# Patient Record
Sex: Female | Born: 1960 | Race: White | Hispanic: No | Marital: Married | Smoking: Never smoker
Health system: Southern US, Community
[De-identification: ages and names within clinical notes are randomized; demographics above are authoritative.]

## PROBLEM LIST (undated history)

## (undated) DIAGNOSIS — F32A Depression, unspecified: Secondary | ICD-10-CM

## (undated) DIAGNOSIS — N3010 Interstitial cystitis (chronic) without hematuria: Secondary | ICD-10-CM

## (undated) DIAGNOSIS — K219 Gastro-esophageal reflux disease without esophagitis: Secondary | ICD-10-CM

## (undated) HISTORY — DX: Gastro-esophageal reflux disease without esophagitis: K21.9

## (undated) HISTORY — DX: Depression, unspecified: F32.A

---
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
## 2001-02-26 ENCOUNTER — Other Ambulatory Visit: Admission: RE | Admit: 2001-02-26 | Discharge: 2001-02-26 | Payer: Self-pay | Admitting: *Deleted

## 2003-09-19 ENCOUNTER — Encounter: Payer: Self-pay | Admitting: Family Medicine

## 2003-09-19 LAB — CONVERTED CEMR LAB
Blood Glucose, Fasting: 87 mg/dL
RBC count: 4.61 10*6/uL
WBC, blood: 4.3 10*3/uL

## 2003-09-20 ENCOUNTER — Encounter: Payer: Self-pay | Admitting: Family Medicine

## 2003-09-23 HISTORY — PX: PARTIAL HYSTERECTOMY: SHX80

## 2004-09-30 ENCOUNTER — Ambulatory Visit: Payer: Self-pay | Admitting: Family Medicine

## 2004-10-29 ENCOUNTER — Other Ambulatory Visit: Admission: RE | Admit: 2004-10-29 | Discharge: 2004-10-29 | Payer: Self-pay | Admitting: Obstetrics and Gynecology

## 2005-05-06 ENCOUNTER — Other Ambulatory Visit: Admission: RE | Admit: 2005-05-06 | Discharge: 2005-05-06 | Payer: Self-pay | Admitting: Obstetrics and Gynecology

## 2005-06-17 ENCOUNTER — Emergency Department: Payer: Self-pay | Admitting: Emergency Medicine

## 2005-09-25 ENCOUNTER — Encounter (INDEPENDENT_AMBULATORY_CARE_PROVIDER_SITE_OTHER): Payer: Self-pay | Admitting: *Deleted

## 2005-09-25 ENCOUNTER — Inpatient Hospital Stay (HOSPITAL_COMMUNITY): Admission: RE | Admit: 2005-09-25 | Discharge: 2005-09-27 | Payer: Self-pay | Admitting: Obstetrics and Gynecology

## 2006-11-27 ENCOUNTER — Ambulatory Visit: Payer: Self-pay | Admitting: Family Medicine

## 2007-10-05 ENCOUNTER — Ambulatory Visit: Payer: Self-pay | Admitting: Family Medicine

## 2007-10-05 ENCOUNTER — Telehealth: Payer: Self-pay | Admitting: Family Medicine

## 2008-01-13 ENCOUNTER — Encounter: Payer: Self-pay | Admitting: Family Medicine

## 2008-07-13 ENCOUNTER — Encounter: Admission: RE | Admit: 2008-07-13 | Discharge: 2008-07-13 | Payer: Self-pay | Admitting: Obstetrics and Gynecology

## 2008-09-11 ENCOUNTER — Ambulatory Visit: Payer: Self-pay | Admitting: Family Medicine

## 2008-09-11 DIAGNOSIS — R109 Unspecified abdominal pain: Secondary | ICD-10-CM

## 2008-09-11 LAB — CONVERTED CEMR LAB
Bilirubin Urine: NEGATIVE
Ketones, urine, test strip: NEGATIVE
Nitrite: NEGATIVE
Specific Gravity, Urine: <1.005
Urobilinogen, UA: 0.2
WBC Urine, dipstick: NEGATIVE

## 2008-09-12 ENCOUNTER — Encounter: Payer: Self-pay | Admitting: Family Medicine

## 2008-09-12 DIAGNOSIS — N3010 Interstitial cystitis (chronic) without hematuria: Secondary | ICD-10-CM | POA: Insufficient documentation

## 2008-10-03 ENCOUNTER — Ambulatory Visit: Payer: Self-pay | Admitting: Family Medicine

## 2008-10-03 LAB — CONVERTED CEMR LAB
Albumin: 3.8 g/dL (ref 3.5–5.2)
Alkaline Phosphatase: 58 units/L (ref 39–117)
Basophils Absolute: 0 10*3/uL (ref 0.0–0.1)
Bilirubin, Direct: 0.1 mg/dL (ref 0.0–0.3)
Calcium: 8.9 mg/dL (ref 8.4–10.5)
Cholesterol: 180 mg/dL (ref 0–200)
Eosinophils Absolute: 0.1 10*3/uL (ref 0.0–0.7)
GFR calc Af Amer: 114 mL/min
GFR calc non Af Amer: 95 mL/min
Glucose, Bld: 93 mg/dL (ref 70–99)
HCT: 40.4 % (ref 36.0–46.0)
HDL: 38.3 mg/dL — ABNORMAL LOW (ref >39.0)
Hemoglobin: 14 g/dL (ref 12.0–15.0)
Lipase: 24 units/L (ref 11.0–59.0)
MCHC: 34.7 g/dL (ref 30.0–36.0)
MCV: 88.5 fL (ref 78.0–100.0)
Monocytes Absolute: 0.4 10*3/uL (ref 0.1–1.0)
Monocytes Relative: 8 % (ref 3.0–12.0)
Neutro Abs: 2.3 10*3/uL (ref 1.4–7.7)
Platelets: 260 10*3/uL (ref 150–400)
Potassium: 3.5 meq/L (ref 3.5–5.1)
RDW: 12.2 % (ref 11.5–14.6)
Sodium: 138 meq/L (ref 135–145)
Total CHOL/HDL Ratio: 4.7
Total Protein: 7.4 g/dL (ref 6.0–8.3)
Triglycerides: 70 mg/dL (ref 0–149)
Uric Acid, Serum: 3.6 mg/dL (ref 2.4–7.0)

## 2008-10-10 ENCOUNTER — Ambulatory Visit: Payer: Self-pay | Admitting: Family Medicine

## 2008-10-10 DIAGNOSIS — E7800 Pure hypercholesterolemia, unspecified: Secondary | ICD-10-CM | POA: Insufficient documentation

## 2008-12-25 ENCOUNTER — Ambulatory Visit: Payer: Self-pay | Admitting: Family Medicine

## 2008-12-25 DIAGNOSIS — H6690 Otitis media, unspecified, unspecified ear: Secondary | ICD-10-CM | POA: Insufficient documentation

## 2008-12-25 DIAGNOSIS — H6980 Other specified disorders of Eustachian tube, unspecified ear: Secondary | ICD-10-CM

## 2009-07-05 ENCOUNTER — Encounter: Admission: RE | Admit: 2009-07-05 | Discharge: 2009-07-05 | Payer: Self-pay | Admitting: Obstetrics and Gynecology

## 2010-02-26 ENCOUNTER — Ambulatory Visit: Payer: Self-pay | Admitting: Urology

## 2010-03-05 ENCOUNTER — Ambulatory Visit: Payer: Self-pay | Admitting: Urology

## 2010-04-30 ENCOUNTER — Encounter (INDEPENDENT_AMBULATORY_CARE_PROVIDER_SITE_OTHER): Payer: Self-pay | Admitting: *Deleted

## 2010-07-08 ENCOUNTER — Encounter: Admission: RE | Admit: 2010-07-08 | Discharge: 2010-07-08 | Payer: Self-pay | Admitting: Unknown Physician Specialty

## 2010-09-02 ENCOUNTER — Ambulatory Visit: Payer: Self-pay | Admitting: Gastroenterology

## 2010-09-03 LAB — PATHOLOGY REPORT

## 2010-10-22 NOTE — Letter (Signed)
Summary: Nadara Eaton letter  [facility] at [facility]  [address] [place], [place] [address]   Phone: [phone]  Fax: [phone]       04/30/2010 MRN: [id number]  [facility] [address] [place], [place]  [address]  Dear Ms. [facility] [place], and  announce the retirement of Arta Silence, M.D., from full-time practice at the [facility] office effective March 21, 2010 and his plans of returning part-time.  It is important to Dr. Hetty Ely and to our practice that you understand that [facility] has seven physicians in our office for your health care needs.  We will continue to offer the same exceptional care that you have today.    Dr. Hetty Ely has spoken to many of you about his plans for retirement and returning part-time in the fall.   We will continue to work with you through the transition to schedule appointments for you in the office and meet the high standards that [facility] is committed to.   Again, it is with great pleasure that we share the news that Dr. Hetty Ely will return to [facility] in October of 2011 with a reduced schedule.    If you have any questions, or would like to request an appointment with one of our physicians, please call us at [phone] and press the option for Scheduling an appointment.  We take pleasure in providing you with excellent patient care and look forward to seeing you at your next office visit.  Our [facility] Physicians are:  Tillman Abide, M.D. Laurita Quint, M.D. Roxy Manns, M.D. Kerby Nora, M.D. Hannah Beat, M.D. Ruthe Mannan, M.D. We proudly welcomed Raechel Ache, M.D. and Eustaquio Boyden, M.D. to the practice in July/August 2011.  Sincerely,  [facility]

## 2010-12-18 ENCOUNTER — Ambulatory Visit (INDEPENDENT_AMBULATORY_CARE_PROVIDER_SITE_OTHER): Payer: 59 | Admitting: Internal Medicine

## 2010-12-18 ENCOUNTER — Encounter: Payer: Self-pay | Admitting: Internal Medicine

## 2010-12-18 DIAGNOSIS — K219 Gastro-esophageal reflux disease without esophagitis: Secondary | ICD-10-CM | POA: Insufficient documentation

## 2010-12-18 DIAGNOSIS — J069 Acute upper respiratory infection, unspecified: Secondary | ICD-10-CM

## 2010-12-24 NOTE — Assessment & Plan Note (Signed)
Summary: coughing,congestion,runny nose   Vital Signs:  Patient Profile:   50 Years Old Female CC:      cough and nasal congestion x 4 days Height:     64 inches (162.56 cm) Temp:     99.3 degrees F Pulse rate:   88 / minute Resp:     14 per minute                  Current Allergies: ! 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 Also husband needed antibiotics to get well last week. Insidious onset of congestion with progressively more purulent nasal discharge and sputum. Has slight sore throat, fatigue, and mild frontal pressure.  REVIEW OF SYSTEMS Constitutional Symptoms       Complains of fatigue.     Denies fever, chills, night sweats, weight loss, and weight gain.  Eyes       Denies change in vision, eye pain, eye discharge, glasses, contact lenses, and eye surgery. Ear/Nose/Throat/Mouth       Complains of frequent runny nose, sore throat, and hoarseness.      Denies hearing loss/aids, change in hearing, ear pain, ear discharge, dizziness, frequent nose bleeds, sinus problems, and tooth pain or bleeding.  Respiratory       Complains of dry cough and productive cough.      Denies wheezing, shortness of breath, asthma, bronchitis, and emphysema/COPD.  Cardiovascular       Denies murmurs, chest pain, and tires easily with exhertion.    Gastrointestinal       Denies stomach pain, nausea/vomiting, diarrhea, constipation, blood in bowel movements, and indigestion. Genitourniary       Denies painful urination, kidney stones, and loss of urinary control. Neurological       Denies paralysis, seizures, and fainting/blackouts. Musculoskeletal       Denies muscle pain, joint pain, joint stiffness, decreased range of motion, redness, swelling, muscle weakness, and gout.  Skin       Denies bruising, unusual mles/lumps or sores, and hair/skin or nail changes.  Psych     Denies mood changes, temper/anger issues, anxiety/stress, speech problems, depression, and sleep problems.  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[employer] Never Smoked Alcohol use-no Drug use-no  Past Medical History: interstitial cystitis GERD  Social History: Marital Status: Married [employer]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 septum deviated to right Oral/Pharynx: tongue normal, posterior pharynx with cobblestoning and mildly purulent pnd Neck: supple,anterior lymphadenopathy present Chest/Lungs: no rales, wheezes, or rhonchi bilateral, breath sounds equal without effort Extremities: normal extremities Neurological: grossly intact  and non-focal Skin: no obvious rashe MSE: oriented to time, place, and person Assessment New Problems: GERD (ICD-530.81) UPPER RESPIRATORY INFECTION, ACUTE (ICD-465.9)   Plan New Medications/Changes: AZITHROMYCIN 250 MG TABS (AZITHROMYCIN) 2 by mouth then 1 by mouth qd  #6 x 0,  12/18/2010, J. Juline Patch MD   The patient and/or caregiver has been counseled thoroughly with regard to medications prescribed including dosage, schedule, interactions, rationale for use, and possible side effects and they verbalize understanding.  Diagnoses and expected course of recovery discussed and will return if not improved as expected or if the condition worsens. Patient and/or caregiver verbalized understanding.  Prescriptions: AZITHROMYCIN 250 MG TABS (AZITHROMYCIN) 2 by mouth then 1 by mouth qd  #6 x 0   Entered and Authorized by:   J. Juline Patch MD   Signed by:   Shela Commons. Juline Patch MD on 12/18/2010   Method used:   Electronically to        [id number] (retail)       [address]       [place], [place]  [address]       Ph: [phone]       Fax: [phone]   RxID:   [id number]   Patient Instructions: 1)  claritin-D every 12 hrs, later switch to claritin 10 mg daily. 2)  afrin generic 2 sprays in each nostril every 12 hours for 3 days maximum followed by 3 days of non-use. Continue cycle as needed to control nasal or ear congestion . 3)  Take 650-1000mg  of Tylenol every 4-6 hours as needed for relief of pain or comfort of fever AVOID taking more than 4000mg   in a 24 hour period (can cause liver damage in higher doses).  4)  We close forever at 5 pm today so followup needs to be at another clinic or emergency dept.

## 2010-12-24 NOTE — Letter (Signed)
Summary: History Form  History Form   Imported By: Eugenio Hoes 12/18/2010 16:18:30  _____________________________________________________________________  External Attachment:    Type:   Image     Comment:   External Document

## 2011-02-07 NOTE — Discharge Summary (Signed)
Angela Park, Angela Park                ACCOUNT NO.:  [id number]   MEDICAL RECORD NO.:  [id number]          PATIENT TYPE:  INP   LOCATION:  9309                          FACILITY:  [facility]   PHYSICIAN:  Dineen Kid. Rana Snare, M.D.    DATE OF BIRTH:  December 05, 1960   DATE OF ADMISSION:  09/25/2005  DATE OF DISCHARGE:  09/27/2005                                 DISCHARGE SUMMARY   HISTORY OF PRESENT ILLNESS:  Ms. Hafford is a 50 year old G4, P3, A1, with  symptomatic pelvic relaxation including urinary frequency, vaginal pressure,  without urinary incontinence.  She had normal cystometrics and urodynamics  as performed by Dr. Brunilda Payor, who is a urologist.  She does have uterine  prolapse to the introitus with Valsalva.  She desires definitive surgical  intervention and presents for hysterectomy with anterior and posterior  colporrhaphy and sacrospinous colposuspension.   HOSPITAL COURSE:  The patient underwent laparoscopically-assisted vaginal  hysterectomy with anterior and posterior colporrhaphy with sacrospinous  suspension of the vagina.  The surgery was uncomplicated.  Estimated blood  loss was 250 mL.  Her postoperative care was complicated by urinary  retention.  Postoperative day #1, her postoperative hemoglobin was 12.6.  She had normoactive bowel sounds.  Her vaginal pack had been removed.  She  was tolerating a regular diet.  By postoperative day #2, after discontinuing  the Foley she was able to void up 400 mL, still had a full sensation.  Ultrasound of the bladder was performed, showing a 525 mL postvoid residual.  The Foley catheter was replaced and the patient was discharged home with a  Foley catheter in place and on Macrobid.   DISPOSITION:  The patient will follow up in the office in three days.  Continue with the Foley catheter until the time of follow-up.  Sent home  with a prescription for Macrobid and also a prescription for Tylox #30, told  to return for increased pain, fever,  bleeding.      Dineen Kid Rana Snare, M.D.  Electronically Signed     DCL/MEDQ  D:  10/22/2005  T:  10/22/2005  Job:  [id number]

## 2011-02-07 NOTE — H&P (Signed)
NAMENAZARENE, BUNNING                 ACCOUNT NO.:  [id number]   MEDICAL RECORD NO.:  [id number]          PATIENT TYPE:   LOCATION:                                 FACILITY:   PHYSICIAN:  Dineen Kid. Rana Snare, M.D.         DATE OF BIRTH:   DATE OF ADMISSION:  09/25/2005  DATE OF DISCHARGE:                                HISTORY & PHYSICAL   Ms. Whiston is a 50 year old G4, P3, A1, with significant problems with pelvic  relaxation, urinary frequency and vaginal pressure.  Denies any urinary  incontinence.  Her pelvic examination is consistent with a third degree  rectocele, cystocele and also prolapse of the uterus to the introitus with  Valsalva.  She does have good UV angle support with Valsalva and has  undergone a recent cystometrics and urodynamics by Dr. Brunilda Payor, who is a  urologist, which showed no significant incontinence issues.  She presents  for definitive surgical intervention and planned hysterectomy with anterior  and posterior colporrhaphy and sacrospinous colposuspension.   PAST MEDICAL HISTORY:  Significant for irritable bowel syndrome.   PAST SURGICAL HISTORY:  Significant for tonsillectomy as a child.   PAST OBSTETRICAL HISTORY:  She has had three vaginal deliveries and one  miscarriage.   MEDICATIONS:  Currently Allegra and Prilosec.   She has no known drug allergies.   PHYSICAL EXAMINATION:  VITAL SIGNS:  Her blood pressure is 112/68,  hemoglobin is 12.3.  CARDIAC:  Regular rate and rhythm.  LUNGS:  Clear to auscultation bilaterally.  ABDOMEN:  Nondistended, nontender.  PELVIC:  External genitalia is essentially normal.  The uterus is  anteverted, mobile, no tenderness to deep palpation.  No adnexal masses are  palpable.  With Valsalva, the cervix does present to the introitus.  She  does have minimal cystocele.  She does have a third degree rectocele.   Ultrasound evaluation of the uterus on June 17, 2005, shows a  retroverted uterus with a 1.3 cm fibroid,  which is intramural in location,  also 2.5 cm and 1.9 cm area as well.  Ovaries are essentially normal in  appearance.   IMPRESSION AND PLAN:  Systematic pelvic relaxation and uterine prolapse.  The patient desires to proceed with surgical intervention.  Dr. Brunilda Payor has  confirmed with cystometrics and urodynamics no evidence of incontinence.  At  this time we do recommend an anterior and posterior colporrhaphy,  sacrospinous suspension of the vagina, as well as laparoscopically-assisted  vaginal hysterectomy.  She desires preservation of both ovaries if they  appear to be normal.  I discussed the procedure at length, including the  risks and benefits.  I discussed this with her husband present.  The risks  include but are not limited to risk of infection, bleeding, damage to bowel,  bladder, ureters, ovaries, the possibility that this may recur in the  future, or problems with the bladder such as prolonged catheter  wear or leaking of urine after the procedure.  I discussed recovery time as  well as the recommendation of light activity for a minimum of six  weeks and  no heavy straining for the next six months.  All of her questions were  answered, and she has given informed consent.      Dineen Kid Rana Snare, M.D.  Electronically Signed     DCL/MEDQ  D:  09/24/2005  T:  09/25/2005  Job:  [id number]

## 2011-02-07 NOTE — Op Note (Signed)
NAMEANICA, Angela Park                ACCOUNT NO.:  [id number]   MEDICAL RECORD NO.:  [id number]          PATIENT TYPE:  OBV   LOCATION:  9399                          FACILITY:  [facility]   PHYSICIAN:  Dineen Kid. Rana Snare, M.D.    DATE OF BIRTH:  Dec 24, 1960   DATE OF PROCEDURE:  09/25/2005  DATE OF DISCHARGE:                                 OPERATIVE REPORT   PREOPERATIVE DIAGNOSIS:  Symptomatic pelvic relaxation with cystocele,  rectocele and uterine prolapse.   POSTOPERATIVE DIAGNOSIS:  Symptomatic pelvic relaxation with cystocele,  rectocele and uterine prolapse.   PROCEDURE:  Laparoscopically assisted vaginal hysterectomy with anterior and  posterior colporrhaphy and sacrospinous ligament suspension of the vagina.   SURGEON:  Dineen Kid. Rana Snare, M.D.   ASSISTANT:  Luvenia Redden, M.D.   ANESTHESIA:  General endotracheal.   INDICATIONS:  Angela Park is a 50 year old G4, P3, A1, with pelvic relaxation,  urinary frequency and vaginal pressure, without urinary incontinence.  She  had a normal cystometrics and urodynamics by Dr. Brunilda Park.  The uterus does  prolapse to the introitus with Valsalva.  She desires to have a surgical  intervention and presents for hysterectomy with anterior and posterior  colporrhaphy and sacrospinous colposuspension.  Risks and benefits were  discussed at length and informed consent was obtained.   FINDINGS AT TIME OF SURGERY:  Normal-appearing appendix; normal-appearing  liver and gallbladder; uterus slightly enlarged and boggy; normal-appearing  ovaries.   DESCRIPTION OF PROCEDURE:  After adequate analgesia, the patient was placed  in the dorsal lithotomy position.  She was sterilely prepped and draped.  The bladder was sterilely drained.  A Hulka tenaculum was placed on the  cervix.  A 1-cm infraumbilical skin incision was made; a Veress needle was  inserted.  The abdomen was insufflated to dullness to percussion.  An 11-mm  trocar was inserted.  The laparoscope was  then inserted and the above  findings were noted.  A 5-mm trocar was inserted to the left of the midline  2 fingerbreadths above the pubic symphysis under direct visualization.  Gyrus cutting forceps were used to grasp the left foreign body, to ligate  and dissect across the fallopian tube, the utero-ovarian ligament and the  round ligament.  The right utero-ovarian ligament was identified in a  similar fashion and ligated and dissected across the utero-ovarian ligament,  the round ligament and the fallopian tube on the right side with good  hemostasis achieved.  The bladder was then elevated and a small window was  made using cutting forceps at the ureterovesical junction, creating a small  bladder flap.  The abdomen was then desufflated, legs were repositioned, a  weighted speculum was placed in the vagina and a posterior colpotomy was  performed.  The cervix was circumscribed with Bovie cautery.  A LigaSure  instrument was used to ligate across the uterosacral ligaments bilaterally  and bladder pillars bilaterally.  The anterior vaginal mucosa was then  undermined and the anterior peritoneum was entered sharply.  The Deaver  retractors were placed underneath the bladder.  The LigaSure was used to  ligate across the uterine vasculature on the inferior portion of the broad  ligament and dissected with the Mayo scissors.  The uterus was then removed  intact.  A small ribbon pack was placed.  The uterosacral ligaments were  identified and suture-ligated with a figure-of-eight of 0 Monocryl suture.  The posterior peritoneum was closed in a pursestring fashion using 0  Monocryl suture.  The posterior vaginal mucosa was closed in a vertical  fashion using figure-of-eights of 0 Monocryl suture.  The anterior vaginal  mucosa was then closed after the pack was removed with figure-of-eights of 0  Monocryl suture.   The apex of the vagina was grasped.  The anterior vaginal mucosa was  dissected in  the midline and reflected laterally up to the UV angle.  Sutures of 0 Monocryl were used to plicate the pubovesicocervical fascia in  the midline using figure-of-eights of 0 Monocryl suture with good  approximation and good hemostasis achieved.  Excess vaginal mucosa was then  removed and the anterior vaginal mucosa was closed with 2-0 Monocryl in  running fashion.  Allis clamps were used to clamp across the hymenal ring at  the posterior fourchette.  A triangular flap was made across the perineal  body and the posterior vaginal mucosa was then undermined with the  Metzenbaum scissors up to the apex of the vagina, reflected laterally with  the Metzenbaum scissors and the puborectal fascia was dissected off the  posterior vaginal mucosa.  Blunt dissection was carried out to the right  sacrospinous ligament.  Two sutures of 0 Prolene were placed using the Shutt  suture ligature carrier with good placement noted and good support noted  through the sacrospinous ligament.  A free needle was used to suture-ligate  this in a pulley fashion to the vaginal apex, care taken to stay below the  vaginal mucosa.  A posterior colporrhaphy was carried out, plicating the  rectal fascia in the midline using figure-of-eights of 0 Monocryl suture in  2 layers with good approximation noted and good hemostasis noted.  Excess  posterior vaginal mucosa was then excised.  The apex was then grasped and  beginning the closure of vaginal apex with a running 2-0 Monocryl suture to  the mid-portion of the vagina.  The sacrospinous suspension sutures were  snugged; 1 suture broke in the process of approximating the vaginal mucosa;  the second suture appeared to have good support and after properly being  tied down and cut, good support noted.  The remaining portion of the vaginal  mucosa was closed.  The perineal body was reinforced using 2 sutures of 0  Monocryl suture in a figure-of-eight fashion across the  superficial tranverse perinei and bulbocavernosus muscles.  The perineum was then closed  using the 2-0 Monocryl in a subcuticular fashion with good approximation and  good hemostasis noted.  Pelvic exam at this time revealed good posterior  support, anterior support of the vaginal apex.  The vagina was packed with  gauze using estrogen cream.  Legs were repositioned, abdomen was  reinsufflated and after a copious amount of irrigation and adequate  hemostasis was assured, the trocars were removed.  The infraumbilical skin  incision was closed with a 0 Vicryl interrupted sutures in the fascia and 3-  0 Vicryl Rapide subcuticular suture.  The 5-mm site was closed with a 3-0  Vicryl Rapide subcuticular suture.  The incisions were injected with 0.25%  Marcaine, a total of 10 mL used.  The patient was  then transferred to the  recovery room in stable condition.  Sponge and instrument count was normal  x3.  Estimated blood loss was 250 mL.  The patient received 1 g of Rocephin  preoperatively.      Dineen Kid Rana Snare, M.D.  Electronically Signed     DCL/MEDQ  D:  09/25/2005  T:  09/25/2005  Job:  [id number]

## 2011-07-29 ENCOUNTER — Ambulatory Visit: Payer: Self-pay | Admitting: Family Medicine

## 2012-08-04 ENCOUNTER — Ambulatory Visit: Payer: Self-pay | Admitting: Family Medicine

## 2013-08-08 ENCOUNTER — Ambulatory Visit: Payer: Self-pay | Admitting: Family Medicine

## 2014-11-14 DIAGNOSIS — N951 Menopausal and female climacteric states: Secondary | ICD-10-CM | POA: Insufficient documentation

## 2014-11-22 DIAGNOSIS — D229 Melanocytic nevi, unspecified: Secondary | ICD-10-CM

## 2014-11-22 HISTORY — DX: Melanocytic nevi, unspecified: D22.9

## 2015-09-20 ENCOUNTER — Other Ambulatory Visit: Payer: Self-pay

## 2015-09-20 ENCOUNTER — Other Ambulatory Visit: Payer: Self-pay | Admitting: *Deleted

## 2015-09-20 DIAGNOSIS — Z1231 Encounter for screening mammogram for malignant neoplasm of breast: Secondary | ICD-10-CM

## 2015-10-03 ENCOUNTER — Ambulatory Visit
Admission: RE | Admit: 2015-10-03 | Discharge: 2015-10-03 | Disposition: A | Discharge status: Home / Self Care | Payer: PRIVATE HEALTH INSURANCE | Source: Ambulatory Visit | Attending: *Deleted | Admitting: *Deleted

## 2015-10-03 DIAGNOSIS — Z1231 Encounter for screening mammogram for malignant neoplasm of breast: Secondary | ICD-10-CM

## 2015-10-08 ENCOUNTER — Ambulatory Visit: Payer: Self-pay

## 2016-05-30 DIAGNOSIS — F419 Anxiety disorder, unspecified: Secondary | ICD-10-CM | POA: Insufficient documentation

## 2016-05-30 DIAGNOSIS — F32A Depression, unspecified: Secondary | ICD-10-CM | POA: Insufficient documentation

## 2016-05-30 DIAGNOSIS — E039 Hypothyroidism, unspecified: Secondary | ICD-10-CM | POA: Insufficient documentation

## 2016-09-22 DIAGNOSIS — D239 Other benign neoplasm of skin, unspecified: Secondary | ICD-10-CM

## 2016-09-22 HISTORY — DX: Other benign neoplasm of skin, unspecified: D23.9

## 2016-10-10 ENCOUNTER — Other Ambulatory Visit: Payer: Self-pay | Admitting: *Deleted

## 2016-10-10 DIAGNOSIS — Z1231 Encounter for screening mammogram for malignant neoplasm of breast: Secondary | ICD-10-CM

## 2016-11-07 ENCOUNTER — Ambulatory Visit
Admission: RE | Admit: 2016-11-07 | Discharge: 2016-11-07 | Disposition: A | Discharge status: Home / Self Care | Payer: 59 | Source: Ambulatory Visit | Attending: *Deleted | Admitting: *Deleted

## 2016-11-07 DIAGNOSIS — Z1231 Encounter for screening mammogram for malignant neoplasm of breast: Secondary | ICD-10-CM | POA: Insufficient documentation

## 2017-10-13 ENCOUNTER — Other Ambulatory Visit: Payer: Self-pay | Admitting: *Deleted

## 2017-10-13 DIAGNOSIS — Z1231 Encounter for screening mammogram for malignant neoplasm of breast: Secondary | ICD-10-CM

## 2017-11-09 ENCOUNTER — Ambulatory Visit
Admission: RE | Admit: 2017-11-09 | Discharge: 2017-11-09 | Disposition: A | Discharge status: Home / Self Care | Payer: 59 | Source: Ambulatory Visit | Attending: *Deleted | Admitting: *Deleted

## 2017-11-09 DIAGNOSIS — Z1231 Encounter for screening mammogram for malignant neoplasm of breast: Secondary | ICD-10-CM | POA: Diagnosis present

## 2018-11-09 ENCOUNTER — Other Ambulatory Visit: Payer: Self-pay | Admitting: *Deleted

## 2018-11-09 ENCOUNTER — Other Ambulatory Visit: Payer: Self-pay | Admitting: Pediatrics

## 2018-11-09 DIAGNOSIS — Z1231 Encounter for screening mammogram for malignant neoplasm of breast: Secondary | ICD-10-CM

## 2019-04-05 ENCOUNTER — Ambulatory Visit
Admission: RE | Admit: 2019-04-05 | Discharge: 2019-04-05 | Disposition: A | Discharge status: Home / Self Care | Payer: 59 | Source: Ambulatory Visit | Attending: *Deleted | Admitting: *Deleted

## 2019-04-05 ENCOUNTER — Other Ambulatory Visit: Payer: Self-pay

## 2019-04-05 DIAGNOSIS — Z1231 Encounter for screening mammogram for malignant neoplasm of breast: Secondary | ICD-10-CM | POA: Insufficient documentation

## 2020-07-24 ENCOUNTER — Other Ambulatory Visit: Payer: Self-pay

## 2020-07-24 ENCOUNTER — Encounter: Payer: Self-pay | Admitting: Dermatology

## 2020-07-24 ENCOUNTER — Ambulatory Visit: Payer: BC Managed Care – PPO | Admitting: Dermatology

## 2020-07-24 DIAGNOSIS — L910 Hypertrophic scar: Secondary | ICD-10-CM

## 2020-07-24 DIAGNOSIS — L821 Other seborrheic keratosis: Secondary | ICD-10-CM | POA: Diagnosis not present

## 2020-07-24 DIAGNOSIS — Z1283 Encounter for screening for malignant neoplasm of skin: Secondary | ICD-10-CM | POA: Diagnosis not present

## 2020-07-24 DIAGNOSIS — D1800 Hemangioma unspecified site: Secondary | ICD-10-CM

## 2020-07-24 DIAGNOSIS — D229 Melanocytic nevi, unspecified: Secondary | ICD-10-CM

## 2020-07-24 DIAGNOSIS — L578 Other skin changes due to chronic exposure to nonionizing radiation: Secondary | ICD-10-CM

## 2020-07-24 DIAGNOSIS — I8393 Asymptomatic varicose veins of bilateral lower extremities: Secondary | ICD-10-CM | POA: Diagnosis not present

## 2020-07-24 DIAGNOSIS — L814 Other melanin hyperpigmentation: Secondary | ICD-10-CM | POA: Diagnosis not present

## 2020-07-24 DIAGNOSIS — D485 Neoplasm of uncertain behavior of skin: Secondary | ICD-10-CM

## 2020-07-24 NOTE — Progress Notes (Signed)
Follow-Up Visit   Subjective  Angela Park is a 59 y.o. female who presents for the following: Annual Exam (TBSE. Hx of dysplastic nevus, right mid back (2016)).  She has a new spot by her nose that concerns her.  She also has thickened scars on the breast that have been treated in the past with injections.   The following portions of the chart were reviewed this encounter and updated as appropriate:     Review of Systems: No other skin or systemic complaints except as noted in HPI or Assessment and Plan.   Objective  Well appearing patient in no apparent distress; mood and affect are within normal limits.  A full examination was performed including scalp, head, eyes, ears, nose, lips, neck, chest, axillae, abdomen, back, buttocks, bilateral upper extremities, bilateral lower extremities, hands, feet, fingers, toes, fingernails, and toenails. All findings within normal limits unless otherwise noted below.  Objective  Left Breast: Dyspigmented smooth firm papule x 2   Objective  bilateral legs: Dilated blood vessels    Objective  right nasal root: 3 mm pink flesh papule  Assessment & Plan  Hypertrophic scar Left Breast  Previously treated with IL kenalog injections.   Recommend continuing ILK injections.  Injected kenalog 10 mg/cc today. 0.3cc total to two areas  Lot: [id number] Exp: 09/2021    Intralesional injection - Left Breast Location: R medial breast  Informed Consent: Discussed risks (infection, pain, bleeding, bruising, thinning of the skin, loss of skin pigment, lack of resolution, and recurrence of lesion) and benefits of the procedure, as well as the alternatives. Informed consent was obtained. Preparation: The area was prepared a standard fashion.  Procedure Details: An intralesional injection was performed with Kenalog 10 mg/cc. 0.3 cc in total were injected.  Total number of injections: <7  Plan: The patient was instructed on post-op care.  Recommend OTC analgesia as needed for pain.   Spider veins of both lower extremities bilateral legs  Discussed sclerotherapy for spider vein treatment.  Discussed that it is a cosmetic procedure and is not covered by insurance ($350/treatment).  Multiple treatments generally necessary to get best results.  Risks including bruising and persistent discoloration due to post-inflammatory hyperpigmentation.  Sclerotherapy does not prevent the development of new spider veins.  Daily compression hose for two weeks after procedure is recommended.      Neoplasm of uncertain behavior of skin right nasal root  Epidermal / dermal shaving  Lesion diameter (cm):  0.3 Informed consent: discussed and consent obtained   Timeout: patient name, date of birth, surgical site, and procedure verified   Anesthesia: the lesion was anesthetized in a standard fashion   Anesthetic:  1% lidocaine w/ epinephrine 1-100,000 buffered w/ 8.4% NaHCO3 Instrument used: flexible razor blade   Hemostasis achieved with: pressure, aluminum chloride and electrodesiccation   Outcome: patient tolerated procedure well   Post-procedure details: wound care instructions given   Post-procedure details comment:  Mupirocin Additional details:  0.5 cm p shave defect     Specimen 1 - Surgical pathology Differential Diagnosis: Sebaceous hyperplasia r/o BCC Check Margins: No 3 mm pink flesh papule   Lentigines - Scattered tan macules - Discussed due to sun exposure - Benign, observe - Call for any changes  Seborrheic Keratoses - Stuck-on, waxy, tan-brown papules and plaques  - Discussed benign etiology and prognosis. - Observe - Call for any changes  Melanocytic Nevi - Tan-brown and/or pink-flesh-colored symmetric macules and papules - Benign appearing on exam today -  Observation - Call clinic for new or changing moles - Recommend daily use of broad spectrum spf 30+ sunscreen to sun-exposed areas.   Hemangiomas -  Red papules - Discussed benign nature - Observe - Call for any changes  Actinic Damage - diffuse scaly erythematous macules with underlying dyspigmentation - Recommend daily broad spectrum sunscreen SPF 30+ to sun-exposed areas, reapply every 2 hours as needed.  - Call for new or changing lesions.  Skin cancer screening performed today.   Return in about 1 year (around 07/24/2021) for 1 year TBSE or sooner for sclerotherapy.   I, Harriett Sine, CMA, am acting as scribe for Brendolyn Patty, MD.  Documentation: I have reviewed the above documentation for accuracy and completeness, and I agree with the above.  Brendolyn Patty MD

## 2020-07-24 NOTE — Patient Instructions (Addendum)
Wound Care Instructions  1. Cleanse wound gently with soap and water once a day then pat dry with clean gauze. Apply a thing coat of Mupirocin (Bactroban) over the wound (unless you have an allergy to this). We recommend that you use a new, sterile tube of Mupirocin. Do not pick or remove scabs. Do not remove the yellow or white "healing tissue" from the base of the wound.  2. Cover the wound with fresh, clean, nonstick gauze and secure with paper tape. You may use Band-Aids in place of gauze and tape if the would is small enough, but would recommend trimming much of the tape off as there is often too much. Sometimes Band-Aids can irritate the skin.  3. You should call the office for your biopsy report after 1 week if you have not already been contacted.  4. If you experience any problems, such as abnormal amounts of bleeding, swelling, significant bruising, significant pain, or evidence of infection, please call the office immediately.  5. FOR ADULT SURGERY PATIENTS: If you need something for pain relief you may take 1 extra strength Tylenol (acetaminophen) AND 2 Ibuprofen (200mg  each) together every 4 hours as needed for pain. (do not take these if you are allergic to them or if you have a reason you should not take them.) Typically, you may only need pain medication for 1 to 3 days.    BEFORE YOUR APPOINTMENT FOR SCLEROTHERAPY  1. When you telephone for your appointment for the sclerotherapy procedure, please let the receptionist know that you are scheduling for the fifteen (15) minute sclerotherapy procedure not just a regular visit.  2. On the day of the procedure, please cleanse and dry the areas, but do not use any moisturizers or other products on the area(s) to be treated.  3. Bring a pair of comfortable shorts to wear during the procedure.  4. Be sure to bring your recommended graduated compression stockings with you to the office. You will be wearing them home when your visit is over.  These compression hose can be purchased at most medical supply stores.  After Your Sclerotherapy Procedure  1. Please wear the graduated compression stockings for 24 hours immediately following the completion of the sclerotherapy procedure.  2. We recommend that you avoid vigorous activity as much as possible for the first twenty-four (24) hours. You can do your "normal" routine, but avoid an above normal amount of time on your feet. Elevating the legs when sitting and avoidance of vigorous leg movements or exercise in the first few days after treatment may improve your results.  3. You may remove the compression dressings (cotton balls) and tape the next morning.  4. Please continue wearing the compression stockings during waking hours for the two (2) weeks following sclerotherapy.  5. If you have any blisters, sores or ulcers or other problems following your procedure please call or return to the office immediately.     THE PROCEDURE FEE IS $350.00 PER FIFTEEN (15) MINUTE SESSION. WE REQUIRE THAT THIS PROCEDURE BE PAID FOR IN FULL ON OR BEFORE THE DATE THAT IT IS PERFORMED. WE WILL GIVE YOU A RECEIPT THAT YOU CAN FILE WITH YOUR INSURANCE COMPANY. WE GENERALLY DO NOT FILE THIS PROCEDURE WITH ANY INSURANCE COMPANY EXCEPT UNDER CERTAIN CIRCUMSTANCES WHERE PRIOR AUTHORIZATION HAS BEEN CONFIRMED. THIS PROCEDURE IS GENERALLY CONSIDERED TO BE A COSMETIC PROCEDURE BY INSURANCE COMPANIES.

## 2020-07-26 ENCOUNTER — Ambulatory Visit: Payer: BC Managed Care – PPO | Admitting: Advanced Practice Midwife

## 2020-07-26 ENCOUNTER — Other Ambulatory Visit (HOSPITAL_COMMUNITY)
Admission: RE | Admit: 2020-07-26 | Discharge: 2020-07-26 | Disposition: A | Discharge status: Home / Self Care | Payer: BC Managed Care – PPO | Source: Ambulatory Visit | Attending: Advanced Practice Midwife | Admitting: Advanced Practice Midwife

## 2020-07-26 ENCOUNTER — Other Ambulatory Visit: Payer: Self-pay

## 2020-07-26 ENCOUNTER — Encounter: Payer: Self-pay | Admitting: Advanced Practice Midwife

## 2020-07-26 VITALS — BP 110/76 | Ht 64.0 in | Wt 147.0 lb

## 2020-07-26 DIAGNOSIS — N898 Other specified noninflammatory disorders of vagina: Secondary | ICD-10-CM

## 2020-07-26 DIAGNOSIS — R350 Frequency of micturition: Secondary | ICD-10-CM

## 2020-07-26 DIAGNOSIS — B9689 Other specified bacterial agents as the cause of diseases classified elsewhere: Secondary | ICD-10-CM | POA: Diagnosis not present

## 2020-07-26 DIAGNOSIS — N760 Acute vaginitis: Secondary | ICD-10-CM | POA: Diagnosis not present

## 2020-07-26 DIAGNOSIS — B373 Candidiasis of vulva and vagina: Secondary | ICD-10-CM | POA: Diagnosis not present

## 2020-07-26 NOTE — Patient Instructions (Signed)
Atrophic Vaginitis  Atrophic vaginitis is a condition in which the tissues that line the vagina become dry and thin. This condition is most common in women who have stopped having regular menstrual periods (are in menopause). This usually starts when a woman is 45-61 years old. That is the time when a woman's estrogen levels begin to drop (decrease). Estrogen is a female hormone. It helps to keep the tissues of the vagina moist. It stimulates the vagina to produce a clear fluid that lubricates the vagina for sexual intercourse. This fluid also protects the vagina from infection. Lack of estrogen can cause the lining of the vagina to get thinner and dryer. The vagina may also shrink in size. It may become less elastic. Atrophic vaginitis tends to get worse over time as a woman's estrogen level drops. What are the causes? This condition is caused by the normal drop in estrogen that happens around the time of menopause. What increases the risk? Certain conditions or situations may lower a woman's estrogen level, leading to a higher risk for atrophic vaginitis. You are more likely to develop this condition if:  You are taking medicines that block estrogen.  You have had your ovaries removed.  You are being treated for cancer with X-ray (radiation) or medicines (chemotherapy).  You have given birth or are breastfeeding.  You are older than age 50.  You smoke. What are the signs or symptoms? Symptoms of this condition include:  Pain, soreness, or bleeding during sexual intercourse (dyspareunia).  Vaginal burning, irritation, or itching.  Pain or bleeding when a speculum is used in a vaginal exam (pelvic exam).  Having burning pain when passing urine.  Vaginal discharge that is brown or yellow. In some cases, there are no symptoms. How is this diagnosed? This condition is diagnosed by taking a medical history and doing a physical exam. This will include a pelvic exam that checks the  vaginal tissues. Though rare, you may also have other tests, including:  A urine test.  A test that checks the acid balance in your vagina (acid balance test). How is this treated? Treatment for this condition depends on how severe your symptoms are. Treatment may include:  Using an over-the-counter vaginal lubricant before sex.  Using a long-acting vaginal moisturizer.  Using low-dose vaginal estrogen for moderate to severe symptoms that do not respond to other treatments. Options include creams, tablets, and inserts (vaginal rings). Before you use a vaginal estrogen, tell your health care provider if you have a history of: ? Breast cancer. ? Endometrial cancer. ? Blood clots. If you are not sexually active and your symptoms are very mild, you may not need treatment. Follow these instructions at home: Medicines  Take over-the-counter and prescription medicines only as told by your health care provider. Do not use herbal or alternative medicines unless your health care provider says that you can.  Use over-the-counter creams, lubricants, or moisturizers for dryness only as directed by your health care provider. General instructions  If your atrophic vaginitis is caused by menopause, discuss all of your menopause symptoms and treatment options with your health care provider.  Do not douche.  Do not use products that can make your vagina dry. These include: ? Scented feminine sprays. ? Scented tampons. ? Scented soaps.  Vaginal intercourse can help to improve blood flow and elasticity of vaginal tissue. If it hurts to have sex, try using a lubricant or moisturizer just before having intercourse. Contact a health care provider if:    Your discharge looks different than normal.  Your vagina has an unusual smell.  You have new symptoms.  Your symptoms do not improve with treatment.  Your symptoms get worse. Summary  Atrophic vaginitis is a condition in which the tissues that  line the vagina become dry and thin. It is most common in women who have stopped having regular menstrual periods (are in menopause).  Treatment options include using vaginal lubricants and low-dose vaginal estrogen.  Contact a health care provider if your vagina has an unusual smell, or if your symptoms get worse or do not improve after treatment. This information is not intended to replace advice given to you by your health care provider. Make sure you discuss any questions you have with your health care provider. Document Revised: 08/21/2017 Document Reviewed: 06/04/2017 Elsevier Patient Education  2020 [employer] Inc.  

## 2020-07-26 NOTE — Progress Notes (Addendum)
Patient ID: Angela Park, female   DOB: 11-May-1961, 59 y.o.   MRN: [id number]  Reason for Consult: Vaginitis (Vaginal Skin looked red,ICC,maybe a spot of blood) and Urinary Tract Infection    Subjective:  HPI:  Angela Park is a 59 y.o. postmenopausal female being seen for chief complaints of urinary frequency x 1 week and vaginal irritation. She denies any symptoms of burning or urgency of urination, incontinence of urine, vaginal discharge, itching or odor. She has a history of Interstitial Cystitis and a Kidney stone. She has had a partial hysterectomy a number of years ago including cervix. She still has ovaries. Last PAP smear was in 2004. Her last mammogram was 1 year ago. She normally has follow up with her urologist, however, that provider moved away and she has had to wait for an appointment with the practice until December. She is sexually active and denies pain with intercourse. She does have some hot flashes and is taking Estrace 0.5 mg. She is following a Keto diet and admits adequate hydration.  Discussed possible flare up of IC symptoms, atrophic vaginitis, comfort measures and follow up as needed after urine culture results.  Past Medical History:  Diagnosis Date  . Atypical mole 11/22/2014   right mid back    Family History  Problem Relation Age of Onset  . Breast cancer Neg Hx    History reviewed. No pertinent surgical history.  Short Social History:  Social History   Tobacco Use  . Smoking status: Never Smoker  . Smokeless tobacco: Never Used  Substance Use Topics  . Alcohol use: Never    Allergies  Allergen Reactions  . Sulfonamide Derivatives     REACTION: unknown    Current Outpatient Medications  Medication Sig Dispense Refill  . ALPRAZolam (XANAX) 0.5 MG tablet Take by mouth.    . bacitracin-polymyxin b (POLYSPORIN) ointment Apply topically.    Marland Kitchen escitalopram (LEXAPRO) 5 MG tablet     . estradiol (ESTRACE) 0.5 MG tablet     . fexofenadine  (ALLEGRA) 180 MG tablet Take by mouth.    . levothyroxine (SYNTHROID) 25 MCG tablet Take by mouth.    Marland Kitchen omeprazole (PRILOSEC) 20 MG capsule Take by mouth.     No current facility-administered medications for this visit.   Review of Systems  Constitutional: Negative for chills and fever.  HENT: Negative for congestion, ear discharge, ear pain, hearing loss, sinus pain and sore throat.   Eyes: Negative for blurred vision and double vision.  Respiratory: Negative for cough, shortness of breath and wheezing.   Cardiovascular: Negative for chest pain, palpitations and leg swelling.  Gastrointestinal: Negative for abdominal pain, blood in stool, constipation, diarrhea, heartburn, melena, nausea and vomiting.  Genitourinary: Positive for frequency. Negative for dysuria, flank pain, hematuria and urgency.       Positive for vaginal irritation  Musculoskeletal: Negative for back pain, joint pain and myalgias.  Skin: Negative for itching and rash.  Neurological: Negative for dizziness, tingling, tremors, sensory change, speech change, focal weakness, seizures, loss of consciousness, weakness and headaches.  Endo/Heme/Allergies: Negative for environmental allergies. Does not bruise/bleed easily.  Psychiatric/Behavioral: Negative for depression, hallucinations, memory loss, substance abuse and suicidal ideas. The patient is not nervous/anxious and does not have insomnia.         Objective:  Objective   Vitals:   07/26/20 1014  BP: 110/76  Weight: 147 lb (66.7 kg)  Height: 5\' 4"  (1.626 m)   Body mass index is  25.23 kg/m. Constitutional: Well nourished, well developed female in no acute distress.  HEENT: normal Skin: Warm and dry.   Respiratory: Clear to auscultation bilateral. Normal respiratory effort Back: no CVAT Neuro: DTRs 2+, Cranial nerves grossly intact Psych: Alert and Oriented x3. No memory deficits. Normal mood and affect.  MS: normal gait, normal bilateral lower extremity  ROM/strength/stability.  Pelvic exam:  is not limited by body habitus EGBUS: some irritation with possible yeast adherent on inner labia Vagina: within normal limits and with normal postpartum vaginal mucosa, decreased muscle strength   Data: Urinalysis normal  Assessment/Plan:     59 y.o. G15 P3013 female with atrophic vaginitis and possible yeast vaginitis, will check urine culture to rule out uti. Urinary frequency possibly related to flare up of IC  Urine Culture Aptima: vaginitis Follow up as needed after labs result and as needed for Gyn concerns   [facility] 07/26/2020, 1:42 PM

## 2020-07-28 LAB — URINE CULTURE

## 2020-07-30 ENCOUNTER — Other Ambulatory Visit: Payer: Self-pay | Admitting: Advanced Practice Midwife

## 2020-07-30 DIAGNOSIS — B9689 Other specified bacterial agents as the cause of diseases classified elsewhere: Secondary | ICD-10-CM

## 2020-07-30 DIAGNOSIS — B373 Candidiasis of vulva and vagina: Secondary | ICD-10-CM

## 2020-07-30 DIAGNOSIS — B3731 Acute candidiasis of vulva and vagina: Secondary | ICD-10-CM

## 2020-07-30 LAB — CERVICOVAGINAL ANCILLARY ONLY
Bacterial Vaginitis (gardnerella): POSITIVE — AB
Candida Glabrata: POSITIVE — AB
Candida Vaginitis: NEGATIVE
Comment: NEGATIVE
Comment: NEGATIVE
Comment: NEGATIVE

## 2020-07-30 MED ORDER — FLUCONAZOLE 150 MG PO TABS: 150.0000 mg | ORAL_TABLET | Freq: Once | ORAL | 1 refills | Status: AC

## 2020-07-30 MED ORDER — METRONIDAZOLE 500 MG PO TABS: 500.0000 mg | ORAL_TABLET | Freq: Two times a day (BID) | ORAL | 0 refills | Status: AC

## 2020-07-30 NOTE — Progress Notes (Signed)
Rx's sent Metronidazole and Diflucan to treat BV and Yeast. Message sent to patient.

## 2020-07-31 ENCOUNTER — Telehealth: Payer: Self-pay

## 2020-07-31 NOTE — Telephone Encounter (Signed)
Advised patient biopsy was benign.  

## 2020-07-31 NOTE — Telephone Encounter (Signed)
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

## 2020-10-11 ENCOUNTER — Other Ambulatory Visit: Payer: Self-pay | Admitting: Pediatrics

## 2020-10-24 ENCOUNTER — Other Ambulatory Visit: Payer: Self-pay | Admitting: *Deleted

## 2020-10-24 DIAGNOSIS — Z1231 Encounter for screening mammogram for malignant neoplasm of breast: Secondary | ICD-10-CM

## 2020-10-29 ENCOUNTER — Ambulatory Visit
Admission: RE | Admit: 2020-10-29 | Discharge: 2020-10-29 | Disposition: A | Discharge status: Home / Self Care | Payer: BC Managed Care – PPO | Source: Ambulatory Visit | Attending: *Deleted | Admitting: *Deleted

## 2020-10-29 ENCOUNTER — Other Ambulatory Visit: Payer: Self-pay

## 2020-10-29 DIAGNOSIS — Z1231 Encounter for screening mammogram for malignant neoplasm of breast: Secondary | ICD-10-CM | POA: Insufficient documentation

## 2021-07-10 ENCOUNTER — Ambulatory Visit
Admission: EM | Admit: 2021-07-10 | Discharge: 2021-07-10 | Disposition: A | Discharge status: Home / Self Care | Payer: BC Managed Care – PPO | Attending: Emergency Medicine | Admitting: Emergency Medicine

## 2021-07-10 ENCOUNTER — Other Ambulatory Visit: Payer: Self-pay

## 2021-07-10 ENCOUNTER — Encounter: Payer: Self-pay | Admitting: Emergency Medicine

## 2021-07-10 DIAGNOSIS — H66002 Acute suppurative otitis media without spontaneous rupture of ear drum, left ear: Secondary | ICD-10-CM | POA: Diagnosis not present

## 2021-07-10 DIAGNOSIS — J0100 Acute maxillary sinusitis, unspecified: Secondary | ICD-10-CM

## 2021-07-10 MED ORDER — AMOXICILLIN-POT CLAVULANATE 875-125 MG PO TABS: 1.0000 | ORAL_TABLET | Freq: Two times a day (BID) | ORAL | 0 refills | Status: AC

## 2021-07-10 MED ORDER — IBUPROFEN 600 MG PO TABS: 600.0000 mg | ORAL_TABLET | Freq: Four times a day (QID) | ORAL | 0 refills | Status: DC | PRN

## 2021-07-10 NOTE — ED Provider Notes (Signed)
HPI  SUBJECTIVE:  Angela Park is a 60 y.o. female who presents with 1 week of body aches, nasal congestion, mild cough, sinus pain and pressure that is worse with bending over.  She states that both of her ears feel "plugged up" with intermittent ear pain and decreased hearing.  She reports a sinus headache, yellow, thick rhinorrhea, postnasal drip and a cough.  She states that she was getting better and then got worse.  No fevers, facial swelling, upper dental pain, wheezing or shortness of breath.  No antibiotics in the past month.  She took ibuprofen within 6 hours of evaluation.  She has been taking ibuprofen 800 mg every 6 hours, Mucinex, cold medicine and is using a steroid nasal spray.  The ibuprofen helps.  Symptoms are worse with bending forward.  She has a past medical history of bilateral otitis media.  She gets sinusitis once a year.  XAJ:OINOMVE, Almyra Free   Past Medical History:  Diagnosis Date   Atypical mole 11/22/2014   right mid back     History reviewed. No pertinent surgical history.  Family History  Problem Relation Age of Onset   Breast cancer Neg Hx     Social History   Tobacco Use   Smoking status: Never   Smokeless tobacco: Never  Vaping Use   Vaping Use: Never used  Substance Use Topics   Alcohol use: Never   Drug use: Never    No current facility-administered medications for this encounter.  Current Outpatient Medications:    ALPRAZolam (XANAX) 0.5 MG tablet, Take by mouth., Disp: , Rfl:    amoxicillin-clavulanate (AUGMENTIN) 875-125 MG tablet, Take 1 tablet by mouth 2 (two) times daily for 7 days., Disp: 14 tablet, Rfl: 0   escitalopram (LEXAPRO) 5 MG tablet, , Disp: , Rfl:    estradiol (ESTRACE) 0.5 MG tablet, , Disp: , Rfl:    ibuprofen (ADVIL) 600 MG tablet, Take 1 tablet (600 mg total) by mouth every 6 (six) hours as needed., Disp: 30 tablet, Rfl: 0   levothyroxine (SYNTHROID) 25 MCG tablet, Take by mouth., Disp: , Rfl:    omeprazole (PRILOSEC)  20 MG capsule, Take by mouth., Disp: , Rfl:    bacitracin-polymyxin b (POLYSPORIN) ointment, Apply topically., Disp: , Rfl:   Allergies  Allergen Reactions   Sulfonamide Derivatives     REACTION: unknown     ROS  As noted in HPI.   Physical Exam  BP 118/81 (BP Location: Left Arm)   Pulse 80   Temp 98.6 F (37 C) (Oral)   Resp 16   SpO2 97%   Constitutional: Well developed, well nourished, no acute distress Eyes:  EOMI, conjunctiva normal bilaterally HENT: Normocephalic, atraumatic,mucus membranes moist.  Right TM normal.  Left TM dull, erythematous, but not bulging.  Nasal septum deviated to the right.  Positive erythematous, swollen turbinates.  Positive nasal congestion.  Positive maxillary sinus tenderness.  No frontal sinus tenderness. Neck: Positive left-sided cervical adenopathy Respiratory: Normal inspiratory effort, lungs clear bilaterally Cardiovascular: Normal rate GI: nondistended skin: No rash, skin intact Musculoskeletal: no deformities Neurologic: Alert & oriented x 3, no focal neuro deficits Psychiatric: Speech and behavior appropriate   ED Course   Medications - No data to display  No orders of the defined types were placed in this encounter.   No results found for this or any previous visit (from the past 24 hour(s)). No results found.  ED Clinical Impression  1. Acute non-recurrent maxillary sinusitis  2. Non-recurrent acute suppurative otitis media of left ear without spontaneous rupture of tympanic membrane      ED Assessment/Plan  Patient with a maxillary sinusitis and left-sided otitis media.  Because of the double sickening, sending home with Augmentin for 7 to 10 days.  She will start using her steroid nasal spray on a regular basis, saline nasal irrigation, Mucinex D, Tylenol/ibuprofen 3-4 times a day as needed.  Follow-up with PMD as needed.   Discussed labs, imaging, MDM, treatment plan, and plan for follow-up with patient.   patient agrees with plan.   Meds ordered this encounter  Medications   amoxicillin-clavulanate (AUGMENTIN) 875-125 MG tablet    Sig: Take 1 tablet by mouth 2 (two) times daily for 7 days.    Dispense:  14 tablet    Refill:  0   ibuprofen (ADVIL) 600 MG tablet    Sig: Take 1 tablet (600 mg total) by mouth every 6 (six) hours as needed.    Dispense:  30 tablet    Refill:  0      *This clinic note was created using [employer]. Therefore, there may be occasional mistakes despite careful proofreading.  ?    Melynda Ripple, MD 07/13/21 [phone]

## 2021-07-10 NOTE — ED Triage Notes (Signed)
Pt presents win sinus pressure/pain, bilateral ear pain, and feels achy x 1 week.

## 2021-07-10 NOTE — Discharge Instructions (Addendum)
Start Mucinex-D to keep the mucous thin and to decongest you.   You may take 600 mg of motrin with 1000 mg of tylenol up to 3-4 times a day as needed for pain. This is an effective combination for pain.  I am giving you a prescription for ibuprofen so that you do not have to take 5 pills at a time. Use a NeilMed sinus rinse with distilled water as often as you want to to reduce nasal congestion. Follow the directions on the box.  Finish the Augmentin, even if you feel better.  Continue using your steroid nasal spray on a regular basis.  Go to www.goodrx.com to look up your medications. This will give you a list of where you can find your prescriptions at the most affordable prices. Or you can ask the pharmacist what the cash price is. This is frequently cheaper than going through insurance.

## 2021-08-06 ENCOUNTER — Ambulatory Visit: Payer: BC Managed Care – PPO | Admitting: Dermatology

## 2021-08-06 ENCOUNTER — Other Ambulatory Visit: Payer: Self-pay

## 2021-08-06 ENCOUNTER — Encounter: Payer: Self-pay | Admitting: Dermatology

## 2021-08-06 DIAGNOSIS — Z1283 Encounter for screening for malignant neoplasm of skin: Secondary | ICD-10-CM

## 2021-08-06 DIAGNOSIS — L578 Other skin changes due to chronic exposure to nonionizing radiation: Secondary | ICD-10-CM | POA: Diagnosis not present

## 2021-08-06 DIAGNOSIS — Z86018 Personal history of other benign neoplasm: Secondary | ICD-10-CM

## 2021-08-06 DIAGNOSIS — D229 Melanocytic nevi, unspecified: Secondary | ICD-10-CM | POA: Diagnosis not present

## 2021-08-06 DIAGNOSIS — I8393 Asymptomatic varicose veins of bilateral lower extremities: Secondary | ICD-10-CM

## 2021-08-06 DIAGNOSIS — L910 Hypertrophic scar: Secondary | ICD-10-CM

## 2021-08-06 DIAGNOSIS — L820 Inflamed seborrheic keratosis: Secondary | ICD-10-CM

## 2021-08-06 DIAGNOSIS — D1800 Hemangioma unspecified site: Secondary | ICD-10-CM

## 2021-08-06 DIAGNOSIS — L814 Other melanin hyperpigmentation: Secondary | ICD-10-CM

## 2021-08-06 DIAGNOSIS — L821 Other seborrheic keratosis: Secondary | ICD-10-CM

## 2021-08-06 NOTE — Patient Instructions (Addendum)
BEFORE YOUR APPOINTMENT FOR SCLEROTHERAPY  1. When you telephone for your appointment for the sclerotherapy procedure, please let the receptionist know that you are scheduling for the fifteen (15) minute sclerotherapy procedure not just a regular visit.  2. On the day of the procedure, please cleanse and dry the areas, but do not use any moisturizers or other products on the area(s) to be treated.  3. Bring a pair of comfortable shorts to wear during the procedure.  4. Be sure to bring your recommended graduated compression stockings with you to the office. You will be wearing them home when your visit is over. These compression hose can be purchased at most medical supply stores.  After Your Sclerotherapy Procedure  1. Please wear the graduated compression stockings for 24 hours immediately following the completion of the sclerotherapy procedure.  2. We recommend that you avoid vigorous activity as much as possible for the first twenty-four (24) hours. You can do your "normal" routine, but avoid an above normal amount of time on your feet. Elevating the legs when sitting and avoidance of vigorous leg movements or exercise in the first few days after treatment may improve your results.  3. You may remove the compression dressings (cotton balls) and tape the next morning.  4. Please continue wearing the compression stockings during waking hours for the two (2) weeks following sclerotherapy.  5. If you have any blisters, sores or ulcers or other problems following your procedure please call or return to the office immediately.     THE PROCEDURE FEE IS $350.00 PER FIFTEEN (15) MINUTE SESSION. WE REQUIRE THAT THIS PROCEDURE BE PAID FOR IN FULL ON OR BEFORE THE DATE THAT IT IS PERFORMED. WE WILL GIVE YOU A RECEIPT THAT YOU CAN FILE WITH YOUR INSURANCE COMPANY. WE GENERALLY DO NOT FILE THIS PROCEDURE WITH ANY INSURANCE COMPANY EXCEPT UNDER CERTAIN CIRCUMSTANCES WHERE PRIOR AUTHORIZATION HAS BEEN  CONFIRMED. THIS PROCEDURE IS GENERALLY CONSIDERED TO BE A COSMETIC PROCEDURE BY INSURANCE COMPANIES.    If you have any questions or concerns for your doctor, please call our main line at [phone] and press option 4 to reach your doctor's medical assistant. If no one answers, please leave a voicemail as directed and we will return your call as soon as possible. Messages left after 4 pm will be answered the following business day.   You may also send [place] a message via [place]. We typically respond to MyChart messages within 1-2 business days.  For prescription refills, please ask your pharmacy to contact our office. Our fax number is [phone].  If you have an urgent issue when the clinic is closed that cannot wait until the next business day, you can page your doctor at the number below.    Please note that while we do our best to be available for urgent issues outside of office hours, we are not available 24/7.   If you have an urgent issue and are unable to reach [place], you may choose to seek medical care at your doctor's office, retail clinic, urgent care center, or emergency room.  If you have a medical emergency, please immediately call 911 or go to the emergency department.  Pager Numbers  - Dr. Nehemiah Massed: [phone]  - Dr. Laurence Ferrari: [phone]  - Dr. Nicole Kindred: [phone]  In the event of inclement weather, please call our main line at [phone] for an update on the status of any delays or closures.  Dermatology Medication Tips: Please keep the boxes that topical medications  come in in order to help keep track of the instructions about where and how to use these. Pharmacies typically print the medication instructions only on the boxes and not directly on the medication tubes.   If your medication is too expensive, please contact our office at [phone] option 4 or send [place] a message through [place].   We are unable to tell what your co-pay for medications will be in  advance as this is different depending on your insurance coverage. However, we may be able to find a substitute medication at lower cost or fill out paperwork to get insurance to cover a needed medication.   If a prior authorization is required to get your medication covered by your insurance company, please allow [place] 1-2 business days to complete this process.  Drug prices often vary depending on where the prescription is filled and some pharmacies may offer cheaper prices.  The website www.goodrx.com contains coupons for medications through different pharmacies. The prices here do not account for what the cost may be with help from insurance (it may be cheaper with your insurance), but the website can give you the price if you did not use any insurance.  - You can print the associated coupon and take it with your prescription to the pharmacy.  - You may also stop by our office during regular business hours and pick up a GoodRx coupon card.  - If you need your prescription sent electronically to a different pharmacy, notify our office through [facility] or by phone at [phone] option 4.

## 2021-08-06 NOTE — Progress Notes (Signed)
Follow-Up Visit   Subjective  Angela Park is a 60 y.o. female who presents for the following: Annual Exam.  Patient here for TBSE. 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   The following portions of the chart were reviewed this encounter and updated as appropriate:       Review of Systems:  No other skin or systemic complaints except as noted in HPI or Assessment and Plan.  Objective  Well appearing patient in no apparent distress; mood and affect are within normal limits.  A full examination was performed including scalp, head, eyes, ears, nose, lips, neck, chest, axillae, abdomen, back, buttocks, bilateral upper extremities, bilateral lower extremities, hands, feet, fingers, toes, fingernails, and toenails. All findings within normal limits unless otherwise noted below.  Left Chest Erythematous keratotic or waxy stuck-on papule   Right Breast Pink smooth firm papule x 2.   Assessment & Plan  Skin cancer screening performed today.  Actinic Damage - chronic, secondary to cumulative UV radiation exposure/sun exposure over time - diffuse scaly erythematous macules with underlying dyspigmentation - Recommend daily broad spectrum sunscreen SPF 30+ to sun-exposed areas, reapply every 2 hours as needed.  - Recommend staying in the shade or wearing long sleeves, sun glasses (UVA+UVB protection) and wide brim hats (4-inch brim around the entire circumference of the hat). - Call for new or changing lesions.  Lentigines - Scattered tan macules - Due to sun exposure - Benign-appering, observe - Recommend daily broad spectrum sunscreen SPF 30+ to sun-exposed areas, reapply every 2 hours as needed. - Call for any changes  Seborrheic Keratoses - Stuck-on, waxy, tan-brown papules, including small waxy flesh papules of the infraocular  - Benign-appearing - Discussed benign etiology and  prognosis. - Observe - Call for any changes - Discussed cosmetic procedure (cryotherapy), noncovered.  $60 for 1st lesion and $15 for each additional lesion if done on the same day.  Maximum charge $350.  One touch-up treatment included no charge. Discussed risks of treatment including dyspigmentation, small scar, and/or recurrence. Recommend daily broad spectrum sunscreen SPF 30+/photoprotection to treated areas once healed.   History of Dysplastic Nevi - No evidence of recurrence today of the R ant hip and R mid back - Recommend regular full body skin exams - Recommend daily broad spectrum sunscreen SPF 30+ to sun-exposed areas, reapply every 2 hours as needed.  - Call if any new or changing lesions are noted between office visits  Melanocytic Nevi - Tan-brown and/or pink-flesh-colored symmetric macules and papules - Benign appearing on exam today - Observation - Call clinic for new or changing moles - Recommend daily use of broad spectrum spf 30+ sunscreen to sun-exposed areas.   Hemangiomas - Red papules - Discussed benign nature - Observe - Call for any changes  Varicose Veins/Spider Veins - Dilated blue, purple or red veins at the lower extremities - Reassured - Smaller vessels can be treated by sclerotherapy (a procedure to inject a medicine into the veins to make them disappear) if desired, but the treatment is not covered by insurance. Larger vessels may be covered if symptomatic and we would refer to vascular surgeon if treatment desired. - pt may schedule for sclerotherapy  Inflamed seborrheic keratosis Left Chest  Start Eucrisa Ointment - Apply BID to AA until improved. Sample given.   Benign, observe.    Hypertrophic scar Right Breast  Start Serica Scar Gel  bid- sample given.   Intralesional injection - Right Breast Location: right breast  Informed Consent: Discussed risks (infection, pain, bleeding, bruising, thinning of the skin, loss of skin pigment,  lack of resolution, and recurrence of lesion) and benefits of the procedure, as well as the alternatives. Informed consent was obtained. Preparation: The area was prepared a standard fashion.  Procedure Details: An intralesional injection was performed with Kenalog 10 mg/cc. 0.2 cc in total were injected, (0.1 cc to each lesion).  Total number of injections: <7  Plan: The patient was instructed on post-op care. Recommend OTC analgesia as needed for pain.  Lot No: [id number] Exp April 2024 [facility] 6381-7711-65   Return for Sclerotherapy and cosmetic LN2, TBSE in 1 year.  IJamesetta Orleans, CMA, am acting as scribe for Brendolyn Patty, MD . Documentation: I have reviewed the above documentation for accuracy and completeness, and I agree with the above.  Brendolyn Patty MD

## 2021-10-14 ENCOUNTER — Encounter: Payer: BC Managed Care – PPO | Admitting: Dermatology

## 2021-11-04 ENCOUNTER — Encounter: Payer: BC Managed Care – PPO | Admitting: Dermatology

## 2021-11-18 ENCOUNTER — Other Ambulatory Visit: Payer: Self-pay

## 2021-11-18 ENCOUNTER — Ambulatory Visit (INDEPENDENT_AMBULATORY_CARE_PROVIDER_SITE_OTHER): Payer: Self-pay | Admitting: Dermatology

## 2021-11-18 DIAGNOSIS — I781 Nevus, non-neoplastic: Secondary | ICD-10-CM

## 2021-11-18 DIAGNOSIS — L821 Other seborrheic keratosis: Secondary | ICD-10-CM

## 2021-11-18 NOTE — Progress Notes (Signed)
° °  Follow-Up Visit   Subjective  Angela Park is a 61 y.o. female who presents for the following: spider veins (Legs, pt presents for sclerotherapy) and cosmetic sks to treat (Face, pt would like LN2).   The following portions of the chart were reviewed this encounter and updated as appropriate:       Review of Systems:  No other skin or systemic complaints except as noted in HPI or Assessment and Plan.  Objective  Well appearing patient in no apparent distress; mood and affect are within normal limits.  A focused examination was performed including face, bil legs. Relevant physical exam findings are noted in the Assessment and Plan.  L upper pretibial, R medial thigh, L lat thigh, L medial thigh Telangiectasias legs           L infraocular x 2, L upper eyelid x 2 (4) Stuck on waxy paps       Assessment & Plan  Spider veins L upper pretibial, R medial thigh, L lat thigh, L medial thigh  Intralesional injection - L upper pretibial, R medial thigh, L lat thigh, L medial thigh The patient presents for desired sclerotherapy for desired treatment of desired treatment of small to medium blue non symptomatic varicosities of the L upper pretibial, R medial thigh, L lat thigh, L medial thigh.  Procedure: The patient was counseled and understands about the effects, side effects and potential risks and complications of the sclerotherapy procedure. The patient was given the opportunity to ask questions. Asclera (polidocanol) 1% (total 4cc) was injected into the varices. In order to ensure correct placement of the catheter in the vein, I drew back slightly to give moderate blood show in the syringe. If there was any evidence or suspicion of extravasation of sclerosant, the area was immediately diluted with a large volume of 0.9% saline. A pressure dressing was applied immediately to the injected sites. The patient tolerated the procedure well without complication. The patient was  instructed in post-operative compression stocking use. The patient understands to call or return immediately if any problems noted.   Seborrheic keratosis (4) L infraocular x 2, L upper eyelid x 2  Discussed cosmetic procedure, noncovered.  $60 for 1st lesion and $15 for each additional lesion if done on the same day.  Maximum charge $350.  One touch-up treatment included no charge. Discussed risks of treatment including dyspigmentation, small scar, and/or recurrence. Recommend daily broad spectrum sunscreen SPF 30+/photoprotection to treated areas once healed.   LN2 x 4  Destruction of lesion - L infraocular x 2, L upper eyelid x 2  Destruction method: cryotherapy   Informed consent: discussed and consent obtained   Lesion destroyed using liquid nitrogen: Yes   Region frozen until ice ball extended beyond lesion: Yes   Outcome: patient tolerated procedure well with no complications   Post-procedure details: wound care instructions given   Additional details:  Prior to procedure, discussed risks of blister formation, small wound, skin dyspigmentation, or rare scar following cryotherapy. Recommend Vaseline ointment to treated areas while healing.    Return if symptoms worsen or fail to improve.  Documentation: I have reviewed the above documentation for accuracy and completeness, and I agree with the above.  Brendolyn Patty MD

## 2021-11-18 NOTE — Patient Instructions (Addendum)
BEFORE YOUR APPOINTMENT FOR SCLEROTHERAPY  1. When you telephone for your appointment for the sclerotherapy procedure, please let the receptionist know that you are scheduling for the fifteen (15) minute sclerotherapy procedure not just a regular visit.  2. On the day of the procedure, please cleanse and dry the areas, but do not use any moisturizers or other products on the area(s) to be treated.  3. Bring a pair of comfortable shorts to wear during the procedure.  4. Be sure to bring your recommended graduated compression stockings with you to the office. You will be wearing them home when your visit is over. These compression hose can be purchased at most medical supply stores.  After Your Sclerotherapy Procedure  1. Please wear the graduated compression stockings for 24 hours immediately following the completion of the sclerotherapy procedure.  2. We recommend that you avoid vigorous activity as much as possible for the first twenty-four (24) hours. You can do your "normal" routine, but avoid an above normal amount of time on your feet. Elevating the legs when sitting and avoidance of vigorous leg movements or exercise in the first few days after treatment may improve your results.  3. You may remove the compression dressings (cotton balls) and tape the next morning.  4. Please continue wearing the compression stockings during waking hours for the two (2) weeks following sclerotherapy.  5. If you have any blisters, sores or ulcers or other problems following your procedure please call or return to the office immediately.     THE PROCEDURE FEE IS $350.00 PER FIFTEEN (15) MINUTE SESSION. WE REQUIRE THAT THIS PROCEDURE BE PAID FOR IN FULL ON OR BEFORE THE DATE THAT IT IS PERFORMED. WE WILL GIVE YOU A RECEIPT THAT YOU CAN FILE WITH YOUR INSURANCE COMPANY. WE GENERALLY DO NOT FILE THIS PROCEDURE WITH ANY INSURANCE COMPANY EXCEPT UNDER CERTAIN CIRCUMSTANCES WHERE PRIOR AUTHORIZATION HAS BEEN  CONFIRMED. THIS PROCEDURE IS GENERALLY CONSIDERED TO BE A COSMETIC PROCEDURE BY INSURANCE COMPANIES.     If You Need Anything After Your Visit  If you have any questions or concerns for your doctor, please call our main line at [phone] and press option 4 to reach your doctor's medical assistant. If no one answers, please leave a voicemail as directed and we will return your call as soon as possible. Messages left after 4 pm will be answered the following business day.   You may also send [place] a message via [place]. We typically respond to MyChart messages within 1-2 business days.  For prescription refills, please ask your pharmacy to contact our office. Our fax number is [phone].  If you have an urgent issue when the clinic is closed that cannot wait until the next business day, you can page your doctor at the number below.    Please note that while we do our best to be available for urgent issues outside of office hours, we are not available 24/7.   If you have an urgent issue and are unable to reach [place], you may choose to seek medical care at your doctor's office, retail clinic, urgent care center, or emergency room.  If you have a medical emergency, please immediately call 911 or go to the emergency department.  Pager Numbers  - Dr. Nehemiah Massed: [phone]  - Dr. Laurence Ferrari: [phone]  - Dr. Nicole Kindred: [phone]  In the event of inclement weather, please call our main line at [phone] for an update on the status of any delays or closures.  Dermatology  Medication Tips: Please keep the boxes that topical medications come in in order to help keep track of the instructions about where and how to use these. Pharmacies typically print the medication instructions only on the boxes and not directly on the medication tubes.   If your medication is too expensive, please contact our office at [phone] option 4 or send [place] a message through [place].   We are unable to tell what  your co-pay for medications will be in advance as this is different depending on your insurance coverage. However, we may be able to find a substitute medication at lower cost or fill out paperwork to get insurance to cover a needed medication.   If a prior authorization is required to get your medication covered by your insurance company, please allow [place] 1-2 business days to complete this process.  Drug prices often vary depending on where the prescription is filled and some pharmacies may offer cheaper prices.  The website www.goodrx.com contains coupons for medications through different pharmacies. The prices here do not account for what the cost may be with help from insurance (it may be cheaper with your insurance), but the website can give you the price if you did not use any insurance.  - You can print the associated coupon and take it with your prescription to the pharmacy.  - You may also stop by our office during regular business hours and pick up a GoodRx coupon card.  - If you need your prescription sent electronically to a different pharmacy, notify our office through [facility] or by phone at [phone] option 4.     Si Usted Necesita Algo Despus de Su Visita  Tambin puede enviarnos un mensaje a travs de Pharmacist, community. Por lo general respondemos a los mensajes de MyChart en el transcurso de 1 a 2 das hbiles.  Para renovar recetas, por favor pida a su farmacia que se ponga en contacto con nuestra oficina. Harland Dingwall de fax es [place] [phone].  Si tiene un asunto urgente cuando la clnica est cerrada y que no puede esperar hasta el siguiente da hbil, puede llamar/localizar a su doctor(a) al nmero que aparece a continuacin.   Por favor, tenga en cuenta que aunque hacemos todo lo posible para estar disponibles para asuntos urgentes fuera del horario de [place], no estamos disponibles las 24 horas del da, los 7 das de la [place].   Si tiene un problema urgente y  no puede comunicarse con nosotros, puede optar por buscar atencin mdica  en el consultorio de su doctor(a), en una clnica privada, en un centro de atencin urgente o en una sala de emergencias.  Si tiene Engineering geologist, por favor llame inmediatamente al 911 o vaya a la sala de emergencias.  Nmeros de bper  - Dr. Nehemiah Massed: [phone]  - Dra. Moye: [phone]  - Dra. Nicole Kindred: [phone]  En caso de inclemencias del [place], por favor llame a Johnsie Kindred principal al [phone] para una actualizacin sobre el [place] de cualquier retraso o cierre.  Consejos para la medicacin en dermatologa: Por favor, guarde las cajas en las que vienen los medicamentos de uso tpico para ayudarle a seguir las instrucciones sobre dnde y cmo usarlos. Las farmacias generalmente imprimen las instrucciones del medicamento slo en las cajas y no directamente en los tubos del [place].   Si su medicamento es muy caro, por favor, pngase en contacto con Zigmund Daniel llamando al [phone] y presione la opcin 4 o envenos un mensaje  a travs de MyChart.   No podemos decirle cul ser su copago por los medicamentos por adelantado ya que esto es diferente dependiendo de la cobertura de su seguro. Sin embargo, es posible que podamos encontrar un medicamento sustituto a Electrical engineer un formulario para que el seguro cubra el medicamento que se considera necesario.   Si se requiere una autorizacin previa para que su compaa de seguros [place] su medicamento, por favor permtanos de 1 a 2 das hbiles para completar este proceso.  Los precios de los medicamentos varan con frecuencia dependiendo del Environmental consultant de dnde se surte la receta y alguna farmacias pueden ofrecer precios ms baratos.  El sitio web www.goodrx.com tiene cupones para medicamentos de Airline pilot. Los precios aqu no tienen en cuenta lo que podra costar con la ayuda del seguro (puede ser ms barato con su  seguro), pero el sitio web puede darle el precio si no utiliz Research scientist (physical sciences).  - Puede imprimir el cupn correspondiente y llevarlo con su receta a la farmacia.  - Tambin puede pasar por nuestra oficina durante el horario de atencin regular y Charity fundraiser una tarjeta de cupones de GoodRx.  - Si necesita que su receta se enve electrnicamente a una farmacia diferente, informe a nuestra oficina a travs de MyChart de [place] o por telfono llamando al [phone] y presione la opcin 4.

## 2022-02-28 ENCOUNTER — Other Ambulatory Visit: Payer: Self-pay | Admitting: *Deleted

## 2022-02-28 DIAGNOSIS — Z1231 Encounter for screening mammogram for malignant neoplasm of breast: Secondary | ICD-10-CM

## 2022-03-31 IMAGING — MG MM DIGITAL SCREENING BILAT W/ TOMO AND CAD
8 series · 8 of 24 positions shown · non-contrast
Comparison: Previous exam(s).

CLINICAL DATA: Screening.

EXAM:
DIGITAL SCREENING BILATERAL MAMMOGRAM WITH TOMOSYNTHESIS AND CAD
TECHNIQUE: Bilateral screening digital craniocaudal and mediolateral oblique
mammograms were obtained. Bilateral screening digital breast
tomosynthesis was performed. The images were evaluated with
computer-aided detection.

[L CC synth-2D]
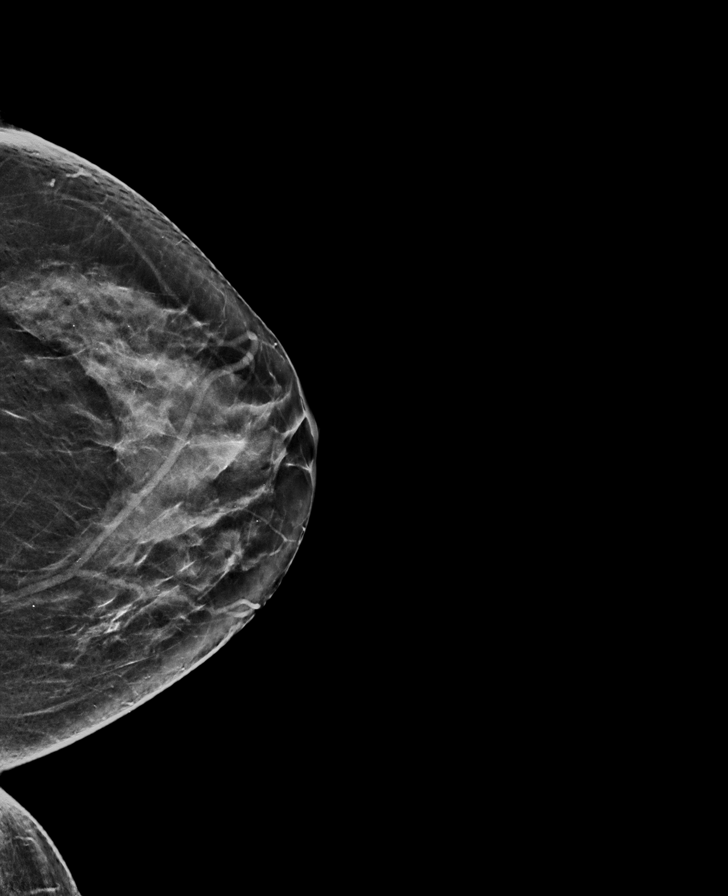

[L MLO synth-2D]
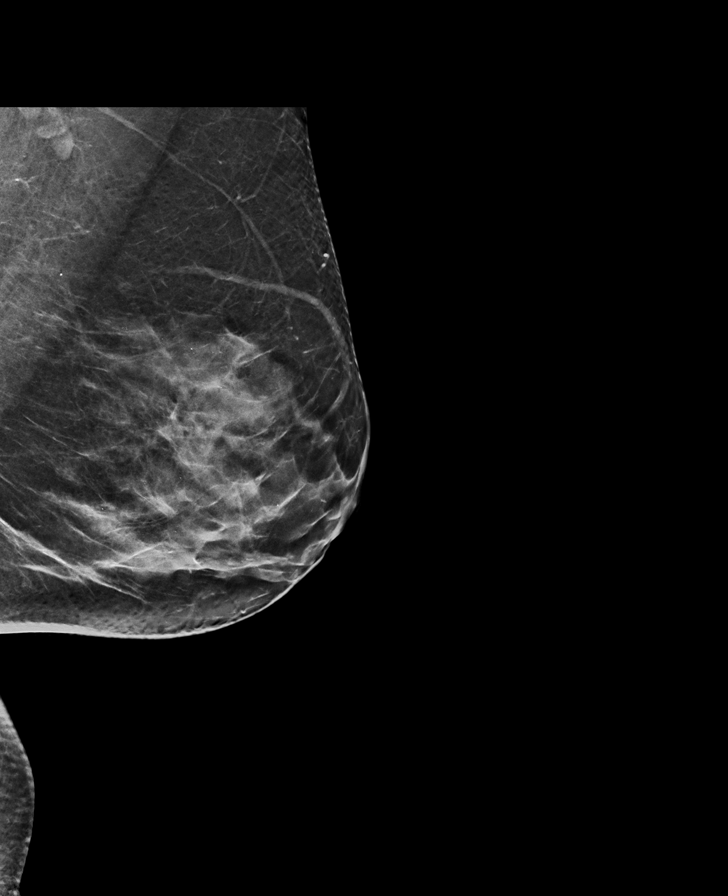

[R MLO synth-2D]
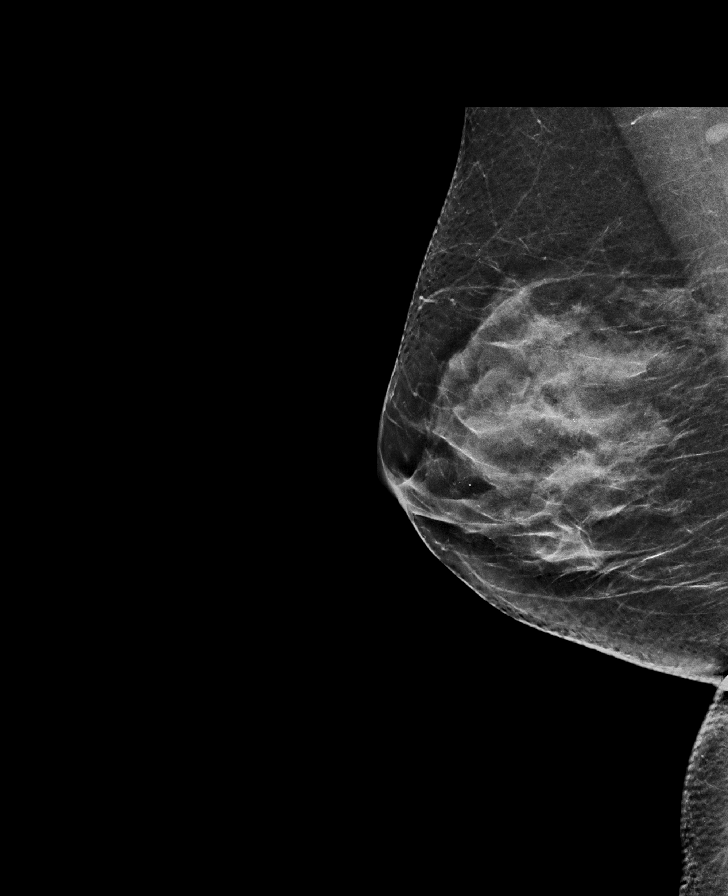

[R CC synth-2D]
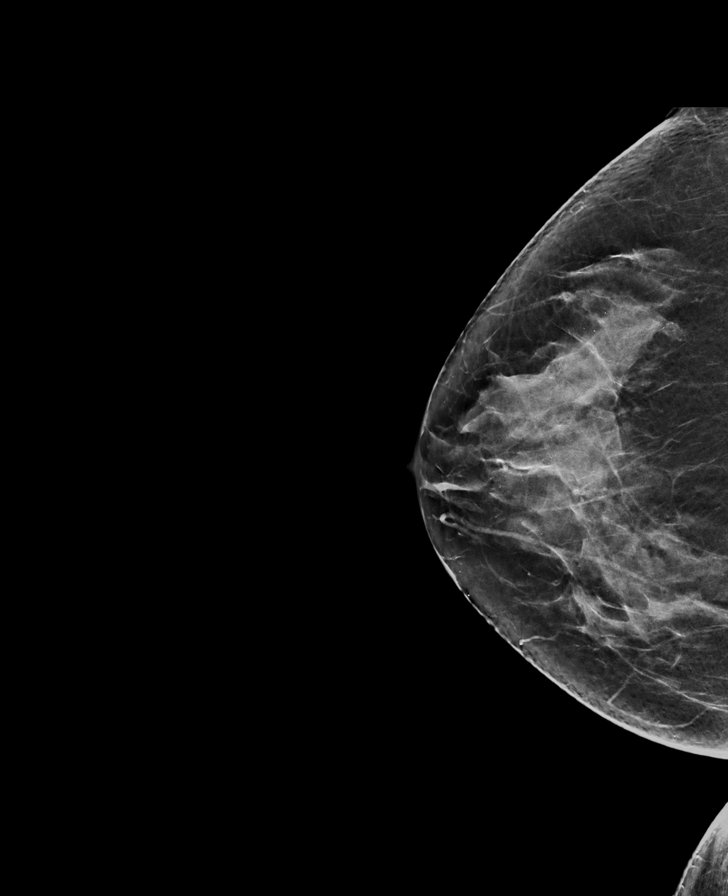

[R MLO tomo · tomo slice 33/66.0]
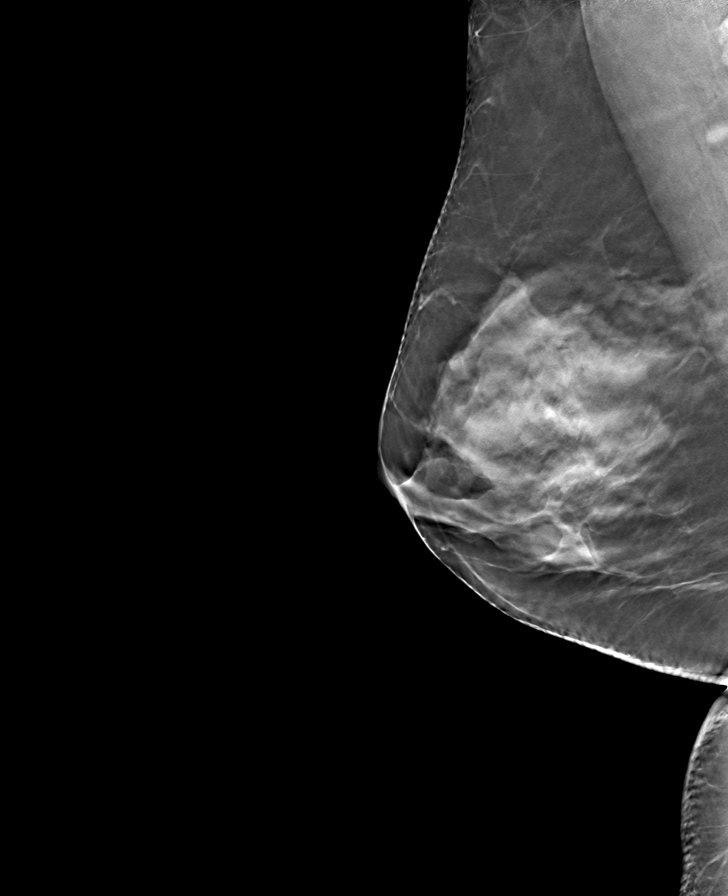

[L MLO tomo · tomo slice 33/66.0]
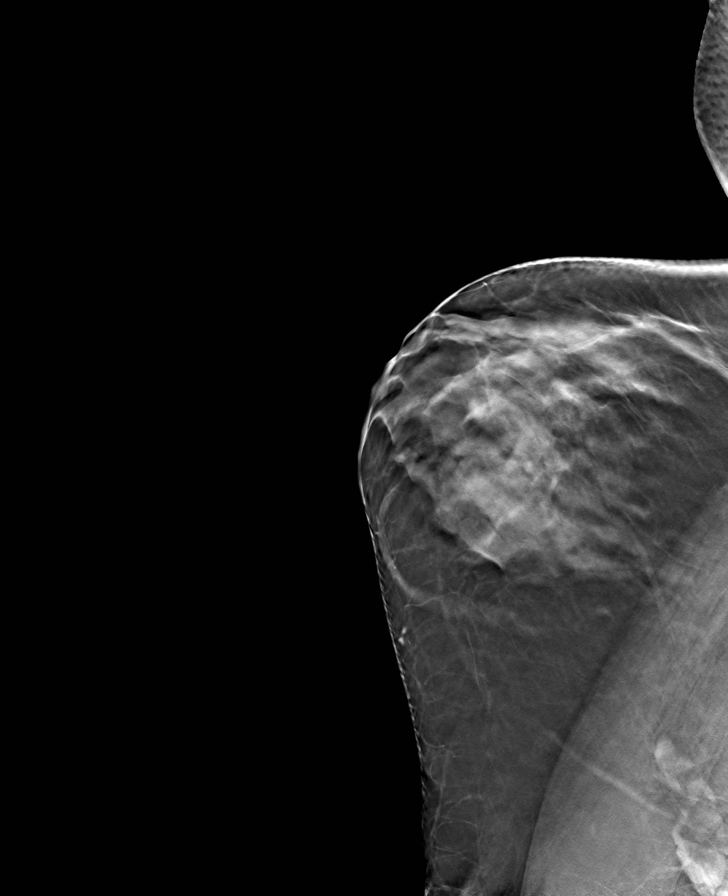

[L CC tomo · tomo slice 31/61.0]
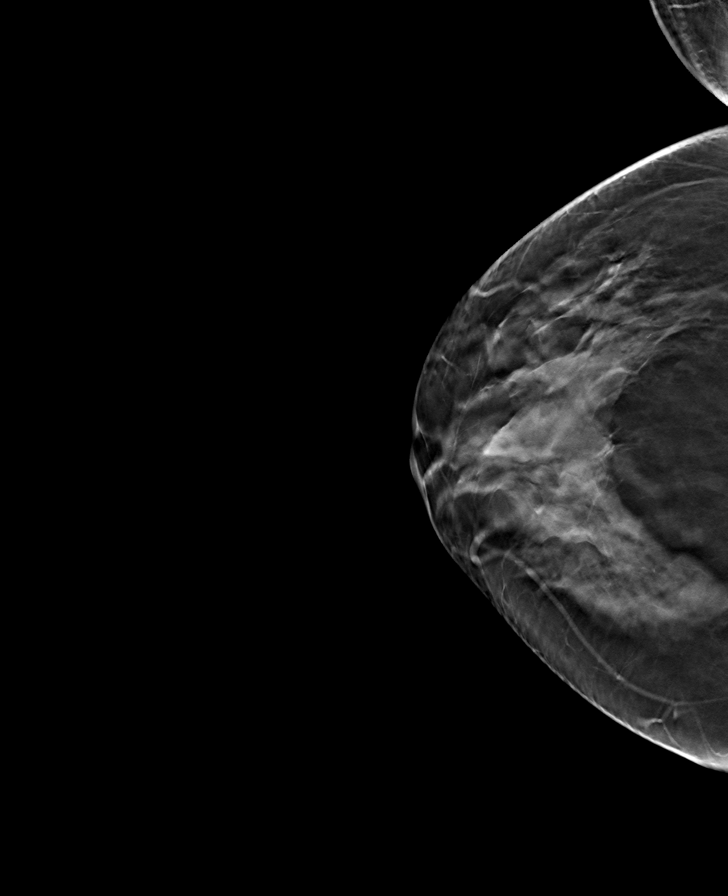

[R CC tomo · tomo slice 34/67.0]
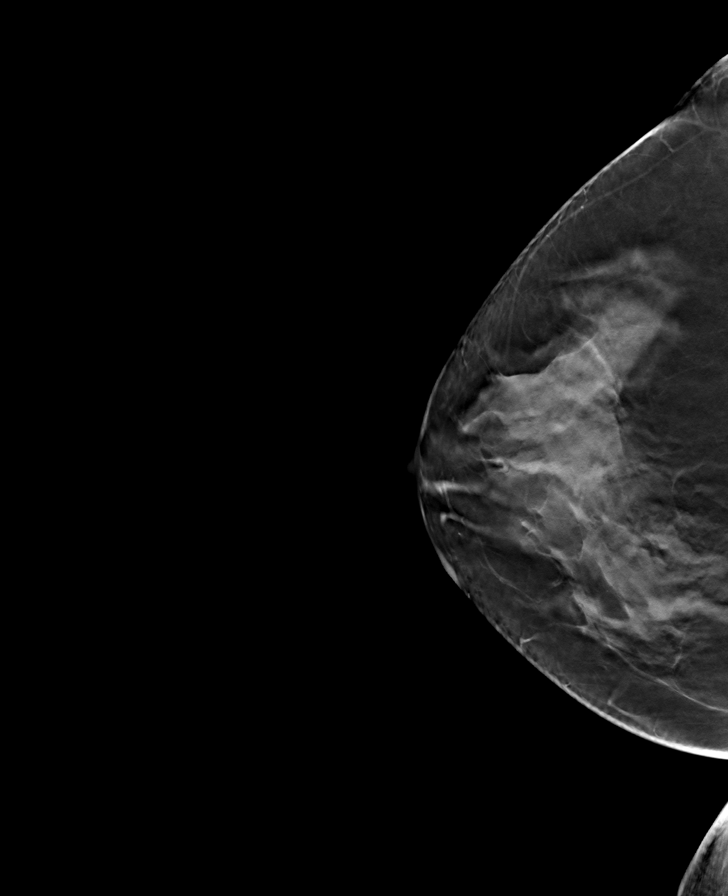

[8 of 24 positions shown; findings below may reference images not displayed]

ACR Breast Density Category c: The breast tissue is heterogeneously
dense, which may obscure small masses.
FINDINGS: There are no findings suspicious for malignancy.
IMPRESSION: No mammographic evidence of malignancy. A result letter of this
screening mammogram will be mailed directly to the patient.

RECOMMENDATION:
Screening mammogram in one year. (Code:Q3-W-BC3)

BI-RADS CATEGORY  1: Negative.

## 2022-04-01 ENCOUNTER — Ambulatory Visit
Admission: RE | Admit: 2022-04-01 | Discharge: 2022-04-01 | Disposition: A | Discharge status: Home / Self Care | Payer: BC Managed Care – PPO | Source: Ambulatory Visit | Attending: *Deleted | Admitting: *Deleted

## 2022-04-01 DIAGNOSIS — Z1231 Encounter for screening mammogram for malignant neoplasm of breast: Secondary | ICD-10-CM | POA: Diagnosis present

## 2022-08-05 ENCOUNTER — Ambulatory Visit: Payer: BC Managed Care – PPO | Admitting: Dermatology

## 2022-08-05 DIAGNOSIS — L821 Other seborrheic keratosis: Secondary | ICD-10-CM

## 2022-08-05 DIAGNOSIS — Z1283 Encounter for screening for malignant neoplasm of skin: Secondary | ICD-10-CM | POA: Diagnosis not present

## 2022-08-05 DIAGNOSIS — L910 Hypertrophic scar: Secondary | ICD-10-CM | POA: Diagnosis not present

## 2022-08-05 DIAGNOSIS — D2261 Melanocytic nevi of right upper limb, including shoulder: Secondary | ICD-10-CM

## 2022-08-05 DIAGNOSIS — L578 Other skin changes due to chronic exposure to nonionizing radiation: Secondary | ICD-10-CM

## 2022-08-05 DIAGNOSIS — L814 Other melanin hyperpigmentation: Secondary | ICD-10-CM

## 2022-08-05 DIAGNOSIS — Z86018 Personal history of other benign neoplasm: Secondary | ICD-10-CM

## 2022-08-05 DIAGNOSIS — D229 Melanocytic nevi, unspecified: Secondary | ICD-10-CM

## 2022-08-05 DIAGNOSIS — I8393 Asymptomatic varicose veins of bilateral lower extremities: Secondary | ICD-10-CM

## 2022-08-05 NOTE — Patient Instructions (Addendum)
BEFORE YOUR APPOINTMENT FOR SCLEROTHERAPY  1. When you telephone for your appointment for the sclerotherapy procedure, please let the receptionist know that you are scheduling for the fifteen (15) minute sclerotherapy procedure not just a regular visit.  2. On the day of the procedure, please cleanse and dry the areas, but do not use any moisturizers or other products on the area(s) to be treated.  3. Bring a pair of comfortable shorts to wear during the procedure.  4. Be sure to bring your recommended graduated compression stockings with you to the office. You will be wearing them home when your visit is over. These compression hose can be purchased at most medical supply stores.  After Your Sclerotherapy Procedure  1. Please wear the graduated compression stockings for 24 hours immediately following the completion of the sclerotherapy procedure.  2. We recommend that you avoid vigorous activity as much as possible for the first twenty-four (24) hours. You can do your "normal" routine, but avoid an above normal amount of time on your feet. Elevating the legs when sitting and avoidance of vigorous leg movements or exercise in the first few days after treatment may improve your results.  3. You may remove the compression dressings (cotton balls) and tape the next morning.  4. Please continue wearing the compression stockings during waking hours for the two (2) weeks following sclerotherapy.  5. If you have any blisters, sores or ulcers or other problems following your procedure please call or return to the office immediately.     THE PROCEDURE FEE IS $350.00 PER FIFTEEN (15) MINUTE SESSION. WE REQUIRE THAT THIS PROCEDURE BE PAID FOR IN FULL ON OR BEFORE THE DATE THAT IT IS PERFORMED. WE WILL GIVE YOU A RECEIPT THAT YOU CAN FILE WITH YOUR INSURANCE COMPANY. WE GENERALLY DO NOT FILE THIS PROCEDURE WITH ANY INSURANCE COMPANY EXCEPT UNDER CERTAIN CIRCUMSTANCES WHERE PRIOR AUTHORIZATION HAS BEEN  CONFIRMED. THIS PROCEDURE IS GENERALLY CONSIDERED TO BE A COSMETIC PROCEDURE BY INSURANCE COMPANIES.    Due to recent changes in healthcare laws, you may see results of your pathology and/or laboratory studies on MyChart before the doctors have had a chance to review them. We understand that in some cases there may be results that are confusing or concerning to you. Please understand that not all results are received at the same time and often the doctors may need to interpret multiple results in order to provide you with the best plan of care or course of treatment. Therefore, we ask that you please give us 2 business days to thoroughly review all your results before contacting the office for clarification. Should we see a critical lab result, you will be contacted sooner.   If You Need Anything After Your Visit  If you have any questions or concerns for your doctor, please call our main line at [phone] and press option 4 to reach your doctor's medical assistant. If no one answers, please leave a voicemail as directed and we will return your call as soon as possible. Messages left after 4 pm will be answered the following business day.   You may also send us a message via MyChart. We typically respond to MyChart messages within 1-2 business days.  For prescription refills, please ask your pharmacy to contact our office. Our fax number is [phone].  If you have an urgent issue when the clinic is closed that cannot wait until the next business day, you can page your doctor at the number below.      Please note that while we do our best to be available for urgent issues outside of office hours, we are not available 24/7.   If you have an urgent issue and are unable to reach us, you may choose to seek medical care at your doctor's office, retail clinic, urgent care center, or emergency room.  If you have a medical emergency, please immediately call 911 or go to the emergency  department.  Pager Numbers  - Dr. Kowalski: [phone]  - Dr. Moye: [phone]  - Dr. Stewart: [phone]  In the event of inclement weather, please call our main line at [phone] for an update on the status of any delays or closures.  Dermatology Medication Tips: Please keep the boxes that topical medications come in in order to help keep track of the instructions about where and how to use these. Pharmacies typically print the medication instructions only on the boxes and not directly on the medication tubes.   If your medication is too expensive, please contact our office at [phone] option 4 or send us a message through MyChart.   We are unable to tell what your co-pay for medications will be in advance as this is different depending on your insurance coverage. However, we may be able to find a substitute medication at lower cost or fill out paperwork to get insurance to cover a needed medication.   If a prior authorization is required to get your medication covered by your insurance company, please allow us 1-2 business days to complete this process.  Drug prices often vary depending on where the prescription is filled and some pharmacies may offer cheaper prices.  The website www.goodrx.com contains coupons for medications through different pharmacies. The prices here do not account for what the cost may be with help from insurance (it may be cheaper with your insurance), but the website can give you the price if you did not use any insurance.  - You can print the associated coupon and take it with your prescription to the pharmacy.  - You may also stop by our office during regular business hours and pick up a GoodRx coupon card.  - If you need your prescription sent electronically to a different pharmacy, notify our office through [facility] MyChart or by phone at [phone] option 4.     Si Usted Necesita Algo Despus de Su Visita  Tambin puede enviarnos un  mensaje a travs de MyChart. Por lo general respondemos a los mensajes de MyChart en el transcurso de 1 a 2 das hbiles.  Para renovar recetas, por favor pida a su farmacia que se ponga en contacto con nuestra oficina. Nuestro nmero de fax es el [phone].  Si tiene un asunto urgente cuando la clnica est cerrada y que no puede esperar hasta el siguiente da hbil, puede llamar/localizar a su doctor(a) al nmero que aparece a continuacin.   Por favor, tenga en cuenta que aunque hacemos todo lo posible para estar disponibles para asuntos urgentes fuera del horario de oficina, no estamos disponibles las 24 horas del da, los 7 das de la semana.   Si tiene un problema urgente y no puede comunicarse con nosotros, puede optar por buscar atencin mdica  en el consultorio de su doctor(a), en una clnica privada, en un centro de atencin urgente o en una sala de emergencias.  Si tiene una emergencia mdica, por favor llame inmediatamente al 911 o vaya a la sala de emergencias.  Nmeros de bper  - Dr. Kowalski:   [phone]  - Dra. Moye: [phone]  - Dra. Stewart: [phone]  En caso de inclemencias del tiempo, por favor llame a nuestra lnea principal al [phone] para una actualizacin sobre el estado de cualquier retraso o cierre.  Consejos para la medicacin en dermatologa: Por favor, guarde las cajas en las que vienen los medicamentos de uso tpico para ayudarle a seguir las instrucciones sobre dnde y cmo usarlos. Las farmacias generalmente imprimen las instrucciones del medicamento slo en las cajas y no directamente en los tubos del medicamento.   Si su medicamento es muy caro, por favor, pngase en contacto con nuestra oficina llamando al [phone] y presione la opcin 4 o envenos un mensaje a travs de MyChart.   No podemos decirle cul ser su copago por los medicamentos por adelantado ya que esto es diferente dependiendo de la cobertura de su seguro. Sin embargo,  es posible que podamos encontrar un medicamento sustituto a menor costo o llenar un formulario para que el seguro cubra el medicamento que se considera necesario.   Si se requiere una autorizacin previa para que su compaa de seguros cubra su medicamento, por favor permtanos de 1 a 2 das hbiles para completar este proceso.  Los precios de los medicamentos varan con frecuencia dependiendo del lugar de dnde se surte la receta y alguna farmacias pueden ofrecer precios ms baratos.  El sitio web www.goodrx.com tiene cupones para medicamentos de diferentes farmacias. Los precios aqu no tienen en cuenta lo que podra costar con la ayuda del seguro (puede ser ms barato con su seguro), pero el sitio web puede darle el precio si no utiliz ningn seguro.  - Puede imprimir el cupn correspondiente y llevarlo con su receta a la farmacia.  - Tambin puede pasar por nuestra oficina durante el horario de atencin regular y recoger una tarjeta de cupones de GoodRx.  - Si necesita que su receta se enve electrnicamente a una farmacia diferente, informe a nuestra oficina a travs de MyChart de [facility] o por telfono llamando al [phone] y presione la opcin 4.  

## 2022-08-05 NOTE — Progress Notes (Signed)
Follow-Up Visit   Subjective  Angela Park is a 61 y.o. female who presents for the following: Annual Exam.  The patient presents for Total-Body Skin Exam (TBSE) for skin cancer screening and mole check.  The patient has spots, moles and lesions to be evaluated, some may be new or changing. She has a history of dysplastic nevi of the right mid back and right anterior hip. She has a hypertrophic scar on the right breast, itchy. Improved in past with steroid injections.   The following portions of the chart were reviewed this encounter and updated as appropriate:       Review of Systems:  No other skin or systemic complaints except as noted in HPI or Assessment and Plan.  Objective  Well appearing patient in no apparent distress; mood and affect are within normal limits.  A full examination was performed including scalp, head, eyes, ears, nose, lips, neck, chest, axillae, abdomen, back, buttocks, bilateral upper extremities, bilateral lower extremities, hands, feet, fingers, toes, fingernails, and toenails. All findings within normal limits unless otherwise noted below.  R shoulder 5.0 mm speckled brown macule  Right Breast, umbilicus Pink smooth firm papule/nodules    Assessment & Plan  Skin cancer screening performed today.  Actinic Damage - chronic, secondary to cumulative UV radiation exposure/sun exposure over time - diffuse scaly erythematous macules with underlying dyspigmentation - Recommend daily broad spectrum sunscreen SPF 30+ to sun-exposed areas, reapply every 2 hours as needed.  - Recommend staying in the shade or wearing long sleeves, sun glasses (UVA+UVB protection) and wide brim hats (4-inch brim around the entire circumference of the hat). - Call for new or changing lesions.  Lentigines - Scattered tan macules - Due to sun exposure - Benign-appearing, observe - Recommend daily broad spectrum sunscreen SPF 30+ to sun-exposed areas, reapply every 2 hours as  needed. - Call for any changes  Seborrheic Keratoses - Stuck-on, waxy, tan-brown papules and/or plaques  - Benign-appearing - Discussed benign etiology and prognosis. - Observe - Call for any changes  Melanocytic Nevi - Tan-brown and/or pink-flesh-colored symmetric macules and papules - Benign appearing on exam today - Observation - Call clinic for new or changing moles - Recommend daily use of broad spectrum spf 30+ sunscreen to sun-exposed areas.   Hemangiomas - Red papules - Discussed benign nature - Observe - Call for any changes  History of Dysplastic Nevi - No evidence of recurrence today of the right mid back and right anterior hip - Recommend regular full body skin exams - Recommend daily broad spectrum sunscreen SPF 30+ to sun-exposed areas, reapply every 2 hours as needed.  - Call if any new or changing lesions are noted between office visits  Varicose Veins/Spider Veins - Dilated blue, purple or red veins at the lower extremities, right inferior knee, L anterior thigh improvement with sclerotherapy, photos compared. May need additional treatments to get desired results - Reassured - Smaller vessels can be treated by sclerotherapy (a procedure to inject a medicine into the veins to make them disappear) if desired, but the treatment is not covered by insurance. Larger vessels may be covered if symptomatic and we would refer to vascular surgeon if treatment desired.  Nevus R shoulder  Benign-appearing.  Observation.  Call clinic for new or changing moles.  Recommend daily use of broad spectrum spf 30+ sunscreen to sun-exposed areas.   Keloid Right Breast, umbilicus  Secondary to blemish many years ago (breast). Improved in the past with ILK injections.  Discussed topical vs ILK injections vs both.   ILK injection to right breast today x 2 at thickened scars Recommend  Strataderm scar treatment QD/BID. Info given to purchase online.   Intralesional steroid  injection side effects were reviewed including thinning of the skin and discoloration, such as redness, lightening or darkening.   Intralesional injection - Right Breast, umbilicus Location: right breast  Informed Consent: Discussed risks (infection, pain, bleeding, bruising, thinning of the skin, loss of skin pigment, lack of resolution, and recurrence of lesion) and benefits of the procedure, as well as the alternatives. Informed consent was obtained. Preparation: The area was prepared a standard fashion.  Procedure Details: An intralesional injection was performed with Kenalog 10 mg/cc. 0.2 cc in total were injected.  Total number of injections: >7  Plan: The patient was instructed on post-op care. Recommend OTC analgesia as needed for pain.  Lot [id number] Exp 07/2024 [name] 2060-1561-53    Return in about 1 year (around 08/06/2023) for TBSE. May schedule sooner for sclerotherapy.Lindi Adie, CMA, am acting as scribe for Brendolyn Patty, MD .  Documentation: I have reviewed the above documentation for accuracy and completeness, and I agree with the above.  Brendolyn Patty MD

## 2022-11-13 ENCOUNTER — Ambulatory Visit (INDEPENDENT_AMBULATORY_CARE_PROVIDER_SITE_OTHER): Payer: BC Managed Care – PPO

## 2022-11-13 DIAGNOSIS — K640 First degree hemorrhoids: Secondary | ICD-10-CM

## 2022-11-13 DIAGNOSIS — K317 Polyp of stomach and duodenum: Secondary | ICD-10-CM

## 2022-11-13 DIAGNOSIS — Z1211 Encounter for screening for malignant neoplasm of colon: Secondary | ICD-10-CM | POA: Diagnosis present

## 2023-04-21 ENCOUNTER — Encounter: Payer: Self-pay | Admitting: Nurse Practitioner

## 2023-04-21 ENCOUNTER — Ambulatory Visit: Payer: BC Managed Care – PPO | Admitting: Nurse Practitioner

## 2023-04-21 VITALS — BP 118/78 | HR 74 | Temp 98.1°F | Ht 64.0 in | Wt 158.8 lb

## 2023-04-21 DIAGNOSIS — Z131 Encounter for screening for diabetes mellitus: Secondary | ICD-10-CM

## 2023-04-21 DIAGNOSIS — K219 Gastro-esophageal reflux disease without esophagitis: Secondary | ICD-10-CM | POA: Diagnosis not present

## 2023-04-21 DIAGNOSIS — F32A Depression, unspecified: Secondary | ICD-10-CM | POA: Diagnosis not present

## 2023-04-21 DIAGNOSIS — E039 Hypothyroidism, unspecified: Secondary | ICD-10-CM | POA: Diagnosis not present

## 2023-04-21 DIAGNOSIS — E7800 Pure hypercholesterolemia, unspecified: Secondary | ICD-10-CM | POA: Diagnosis not present

## 2023-04-21 DIAGNOSIS — Z1231 Encounter for screening mammogram for malignant neoplasm of breast: Secondary | ICD-10-CM

## 2023-04-21 DIAGNOSIS — F419 Anxiety disorder, unspecified: Secondary | ICD-10-CM

## 2023-04-21 DIAGNOSIS — N951 Menopausal and female climacteric states: Secondary | ICD-10-CM

## 2023-04-21 LAB — COMPREHENSIVE METABOLIC PANEL
ALT: 21 U/L (ref 0–35)
AST: 19 U/L (ref 0–37)
Albumin: 4.4 g/dL (ref 3.5–5.2)
Alkaline Phosphatase: 70 U/L (ref 39–117)
BUN: 17 mg/dL (ref 6–23)
CO2: 31 mEq/L (ref 19–32)
Calcium: 9.6 mg/dL (ref 8.4–10.5)
Chloride: 99 mEq/L (ref 96–112)
Creatinine, Ser: 0.78 mg/dL (ref 0.40–1.20)
GFR: 80.54 mL/min (ref >60.00)
Glucose, Bld: 100 mg/dL — ABNORMAL HIGH (ref 70–99)
Potassium: 4.5 mEq/L (ref 3.5–5.1)
Sodium: 136 mEq/L (ref 135–145)
Total Bilirubin: 0.4 mg/dL (ref 0.2–1.2)
Total Protein: 7.8 g/dL (ref 6.0–8.3)

## 2023-04-21 LAB — LIPID PANEL
Cholesterol: 234 mg/dL — ABNORMAL HIGH (ref 0–200)
HDL: 51.5 mg/dL (ref >39.00)
LDL Cholesterol: 145 mg/dL — ABNORMAL HIGH (ref 0–99)
NonHDL: 182.55
Total CHOL/HDL Ratio: 5
Triglycerides: 189 mg/dL — ABNORMAL HIGH (ref 0.0–149.0)
VLDL: 37.8 mg/dL (ref 0.0–40.0)

## 2023-04-21 LAB — CBC WITH DIFFERENTIAL/PLATELET
Basophils Absolute: 0 10*3/uL (ref 0.0–0.1)
Basophils Relative: 0.6 % (ref 0.0–3.0)
Eosinophils Absolute: 0.1 10*3/uL (ref 0.0–0.7)
Eosinophils Relative: 2.4 % (ref 0.0–5.0)
HCT: 43.8 % (ref 36.0–46.0)
Hemoglobin: 14.5 g/dL (ref 12.0–15.0)
Lymphocytes Relative: 33.5 % (ref 12.0–46.0)
Lymphs Abs: 1.7 10*3/uL (ref 0.7–4.0)
MCHC: 33 g/dL (ref 30.0–36.0)
MCV: 89 fl (ref 78.0–100.0)
Monocytes Absolute: 0.4 10*3/uL (ref 0.1–1.0)
Monocytes Relative: 7.7 % (ref 3.0–12.0)
Neutro Abs: 2.8 10*3/uL (ref 1.4–7.7)
Neutrophils Relative %: 55.8 % (ref 43.0–77.0)
Platelets: 299 10*3/uL (ref 150.0–400.0)
RBC: 4.93 Mil/uL (ref 3.87–5.11)
RDW: 13.7 % (ref 11.5–15.5)
WBC: 5.1 10*3/uL (ref 4.0–10.5)

## 2023-04-21 LAB — HEMOGLOBIN A1C: Hgb A1c MFr Bld: 5.9 % (ref 4.6–6.5)

## 2023-04-21 LAB — TSH: TSH: 4.58 u[IU]/mL (ref 0.35–5.50)

## 2023-04-21 LAB — VITAMIN D 25 HYDROXY (VIT D DEFICIENCY, FRACTURES): VITD: 46.37 ng/mL (ref 30.00–100.00)

## 2023-04-21 MED ORDER — ALPRAZOLAM 0.5 MG PO TABS: 0.5000 mg | ORAL_TABLET | Freq: Every day | ORAL | 2 refills | Status: DC | PRN

## 2023-04-21 MED ORDER — OMEPRAZOLE 40 MG PO CPDR
40.0000 mg | DELAYED_RELEASE_CAPSULE | Freq: Every day | ORAL | 3 refills | Status: DC
Start: 2023-04-21 — End: 2024-07-29

## 2023-04-21 MED ORDER — VEOZAH 45 MG PO TABS
1.0000 | ORAL_TABLET | Freq: Every day | ORAL | 3 refills | Status: DC
Start: 2023-04-21 — End: 2024-04-20

## 2023-04-21 NOTE — Patient Instructions (Signed)
YOUR MAMMOGRAM IS DUE, PLEASE CALL AND GET THIS SCHEDULED! [facility] - call [phone]    

## 2023-04-21 NOTE — Assessment & Plan Note (Signed)
Chronic. Stable on current medication regimen, Lexapro 5 mg daily and Xanax 0.5 mg daily PRN. Continue. Refills sent. Counseled patient on common side effects. Encouraged to contact if worsening symptoms, unusual behavior changes or suicidal thoughts occur. Will continue to monitor. PDMP reviewed. Non-Opioid Controlled Substance agreement signed today in office.

## 2023-04-21 NOTE — Assessment & Plan Note (Signed)
Chronic. Stable with diet control. Will check fasting lipid panel today. Encouraged healthy diet and exercise. The 10-year ASCVD risk score (Arnett DK, et al., 2019) is: 4.6%   Values used to calculate the score:     Age: 62 years     Sex: Female     Is Non-Hispanic African American: No     Diabetic: No     Tobacco smoker: No     Systolic Blood Pressure: 118 mmHg     Is BP treated: No     HDL Cholesterol: 50 mg/dL     Total Cholesterol: 247 mg/dL

## 2023-04-21 NOTE — Assessment & Plan Note (Signed)
Chronic. Stable on Levothyroxine 25 mcg, half tablet daily. Continue. Will check TSH today.

## 2023-04-21 NOTE — Assessment & Plan Note (Signed)
Patient currently taking Estrace daily. She is interested in trying something non-hormonal. She continues to have hot flashes nightly. Will discontinue Estrace and trial Veozah 45 mg daily, pending insurance approval. Will continue to monitor.

## 2023-04-21 NOTE — Progress Notes (Signed)
Bethanie Dicker, NP-C Phone: [phone]  Angela Park is a 62 y.o. female who presents today to establish care.   HYPERLIPIDEMIA Symptoms Chest pain on exertion:  No   Leg claudication:   No Medications: Compliance- Diet controlled Right upper quadrant pain- No  Muscle aches- No Lipid Panel     Component Value Date/Time   CHOL 180 10/03/2008 1023   TRIG 70 10/03/2008 1023   HDL 38.3 (L) 10/03/2008 1023   CHOLHDL 4.7 CALC 10/03/2008 1023   VLDL 14 10/03/2008 1023   LDLCALC 128 (H) 10/03/2008 1023   GERD: Followed by Gastroenterology  Reflux symptoms: Heartburn, reflux, bitter taste in mouth   Abd pain: No   Blood in stool: No  Dysphagia: No   EGD: 2024- WNL  Medication: Omeprazole 40 mg daily  HYPOTHYROIDISM Disease Monitoring Weight changes: No  Skin Changes: No Palpitations: No Heat/Cold intolerance: No Medication Monitoring Compliance:  Levothyroxine 25 mcg- 1/2 tablet daily   Last TSH:  No results found for: "TSH"  4.03 on 06/19/2022  Anxiety/Depression- Currently taking Lexapro 5 mg daily and Xanax 0.5 mg , half tablet as needed. She reports only taking her Xanax a few times a month. Her symptoms are well managed at this time on her medications. She denies SI/HI. She does not see a therapist or psychiatrist.   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   ROM 12/25/2008   CHRONIC INTERSTITIAL CYSTITIS 09/12/2008   FLANK PAIN, LEFT 09/11/2008   GERD 12/18/2010   Menopausal syndrome (hot flashes) 11/14/2014   Acquired hypothyroidism 05/30/2016   Anxiety and depression 05/30/2016   Resolved Ambulatory Problems    Diagnosis Date Noted   No Resolved Ambulatory Problems   Past Medical History:  Diagnosis Date   Atypical mole 11/22/2014   Depression    Dysplastic nevus 2018   GERD (gastroesophageal reflux disease)     Family History  Problem Relation Age of Onset    Miscarriages / Stillbirths Mother    Hearing loss Mother    Diabetes Mother    Arthritis Mother    Depression Sister    Diabetes Sister    Depression Sister    Breast cancer Neg Hx     Social History   Socioeconomic History   Marital status: Married    Spouse name: Not on file   Number of children: Not on file   Years of education: Not on file   Highest education level: Not on file  Occupational History   Not on file  Tobacco Use   Smoking status: Never   Smokeless tobacco: Never  Vaping Use   Vaping status: Never Used  Substance and Sexual Activity   Alcohol use: Never   Drug use: Never   Sexual activity: Yes  Other Topics Concern   Not on file  Social History Narrative   Not on file   Social Determinants of Health   Financial Resource Strain: Not on file  Food Insecurity: Not on file  Transportation Needs: Not on file  Physical Activity: Not on file  Stress: Not on file  Social Connections: Not on file  Intimate Partner Violence: Not on file    ROS  General:  Negative for unexplained weight loss, fever Skin: Negative for new or changing mole, sore that won't heal HEENT: Negative for trouble hearing, trouble seeing, ringing in ears, mouth sores, hoarseness, change in voice, dysphagia. CV:  Negative for  chest pain, dyspnea, edema, palpitations Resp: Negative for cough, dyspnea, hemoptysis GI: Negative for nausea, vomiting, diarrhea, constipation, abdominal pain, melena, hematochezia. GU: Negative for dysuria, incontinence, urinary hesitance, hematuria, vaginal or penile discharge, polyuria, sexual difficulty, lumps in testicle or breasts MSK: Negative for muscle cramps or aches, joint pain or swelling Neuro: Negative for headaches, weakness, numbness, dizziness, passing out/fainting Psych: Negative for depression, anxiety, memory problems  Objective  Physical Exam Vitals:   04/21/23 0851  BP: 118/78  Pulse: 74  Temp: 98.1 F (36.7 C)  SpO2: 97%     BP Readings from Last 3 Encounters:  04/21/23 118/78  07/10/21 118/81  07/26/20 110/76   Wt Readings from Last 3 Encounters:  04/21/23 158 lb 12.8 oz (72 kg)  07/26/20 147 lb (66.7 kg)  11/22/03 138 lb (62.6 kg)    Physical Exam Constitutional:      General: She is not in acute distress.    Appearance: Normal appearance.  HENT:     Head: Normocephalic.  Cardiovascular:     Rate and Rhythm: Normal rate and regular rhythm.     Heart sounds: Normal heart sounds.  Pulmonary:     Effort: Pulmonary effort is normal.     Breath sounds: Normal breath sounds.  Skin:    General: Skin is warm and dry.  Neurological:     General: No focal deficit present.     Mental Status: She is alert.  Psychiatric:        Mood and Affect: Mood normal.        Behavior: Behavior normal.    Assessment/Plan:   Anxiety and depression Assessment & Plan: Chronic. Stable on current medication regimen, Lexapro 5 mg daily and Xanax 0.5 mg daily PRN. Continue. Refills sent. Counseled patient on common side effects. Encouraged to contact if worsening symptoms, unusual behavior changes or suicidal thoughts occur. Will continue to monitor. PDMP reviewed. Non-Opioid Controlled Substance agreement signed today in office.   Orders: -     VITAMIN D 25 Hydroxy (Vit-D Deficiency, Fractures) -     ALPRAZolam; Take 1 tablet (0.5 mg total) by mouth daily as needed for anxiety.  Dispense: 30 tablet; Refill: 2  HYPERCHOLESTEROLEMIA Assessment & Plan: Chronic. Stable with diet control. Will check fasting lipid panel today. Encouraged healthy diet and exercise. The 10-year ASCVD risk score (Arnett DK, et al., 2019) is: 4.6%   Values used to calculate the score:     Age: 78 years     Sex: Female     Is Non-Hispanic African American: No     Diabetic: No     Tobacco smoker: No     Systolic Blood Pressure: 118 mmHg     Is BP treated: No     HDL Cholesterol: 50 mg/dL     Total Cholesterol: 247  mg/dL   Orders: -     Comprehensive metabolic panel -     Lipid panel  Gastroesophageal reflux disease without esophagitis Assessment & Plan: Chronic. Stable on Omeprazole 40 mg daily. Continue. Refills sent. Encouraged patient to monitor diet for triggers such as decreasing caffeine and alcohol intake and avoiding spicy and acidic foods. Dietary information provided to patient. Follow up with Gastroenterology as needed.   Orders: -     CBC with Differential/Platelet -     Omeprazole; Take 1 capsule (40 mg total) by mouth daily.  Dispense: 90 capsule; Refill: 3  Acquired hypothyroidism Assessment & Plan: Chronic. Stable on Levothyroxine 25 mcg, half tablet daily.  Continue. Will check TSH today.   Orders: -     TSH  Menopausal syndrome (hot flashes) Assessment & Plan: Patient currently taking Estrace daily. She is interested in trying something non-hormonal. She continues to have hot flashes nightly. Will discontinue Estrace and trial Veozah 45 mg daily, pending insurance approval. Will continue to monitor.   Orders: Allyne Gee; Take 1 tablet (45 mg total) by mouth daily.  Dispense: 90 tablet; Refill: 3  Diabetes mellitus screening -     Hemoglobin A1c  Screening mammogram for breast cancer -     3D Screening Mammogram, Left and Right; Future    Return in about 6 months (around 10/22/2023) for Follow up.   Bethanie Dicker, NP-C [facility] - [employer]

## 2023-04-21 NOTE — Assessment & Plan Note (Addendum)
Chronic. Stable on Omeprazole 40 mg daily. Continue. Refills sent. Encouraged patient to monitor diet for triggers such as decreasing caffeine and alcohol intake and avoiding spicy and acidic foods. Dietary information provided to patient. Follow up with Gastroenterology as needed.

## 2023-04-22 ENCOUNTER — Other Ambulatory Visit: Payer: Self-pay | Admitting: Nurse Practitioner

## 2023-04-22 DIAGNOSIS — E039 Hypothyroidism, unspecified: Secondary | ICD-10-CM

## 2023-04-22 DIAGNOSIS — R7303 Prediabetes: Secondary | ICD-10-CM

## 2023-04-22 MED ORDER — LEVOTHYROXINE SODIUM 25 MCG PO TABS: 25.0000 ug | ORAL_TABLET | Freq: Every day | ORAL | 3 refills | Status: DC

## 2023-04-23 ENCOUNTER — Other Ambulatory Visit (HOSPITAL_COMMUNITY): Payer: Self-pay

## 2023-04-23 ENCOUNTER — Telehealth: Payer: Self-pay

## 2023-04-23 ENCOUNTER — Encounter: Payer: Self-pay | Admitting: Nurse Practitioner

## 2023-04-23 NOTE — Telephone Encounter (Signed)
Clinical questions answered. PA submitted

## 2023-04-23 NOTE — Telephone Encounter (Signed)
Pt needs a PA for: Fezolinetant (VEOZAH) 45 MG TABS    Provider Bethanie Dicker, NP

## 2023-04-23 NOTE — Telephone Encounter (Signed)
Pharmacy Patient Advocate Encounter   Received notification from Pt Calls Messages that prior authorization for Veozah 45MG  tablets is required/requested.   Insurance verification completed.   The patient is insured through [facility] .   Per test claim: PA required; PA started via CoverMyMeds. KEY [id number] . Waiting for clinical questions to populate.

## 2023-04-27 ENCOUNTER — Telehealth: Payer: Self-pay

## 2023-04-27 NOTE — Telephone Encounter (Signed)
LMOM to CB in regards to lab results.

## 2023-04-30 NOTE — Telephone Encounter (Signed)
Pharmacy Patient Advocate Encounter  Received notification from [facility] that Prior Authorization for Veozah 45mg  has been DENIED. Please advise how you'd like to proceed. Full denial letter will be uploaded to the media tab. See denial reason below.   PA #/Case ID/Reference #: [id number]   Denial Reason: Must try and fail or be unable to take ALL formulary alternatives

## 2023-05-13 ENCOUNTER — Other Ambulatory Visit: Payer: Self-pay | Admitting: Nurse Practitioner

## 2023-05-13 DIAGNOSIS — F32A Depression, unspecified: Secondary | ICD-10-CM

## 2023-05-13 DIAGNOSIS — N951 Menopausal and female climacteric states: Secondary | ICD-10-CM

## 2023-05-13 MED ORDER — PAROXETINE HCL 10 MG PO TABS: 10.0000 mg | ORAL_TABLET | Freq: Every day | ORAL | 1 refills | Status: DC

## 2023-08-18 ENCOUNTER — Ambulatory Visit: Payer: BC Managed Care – PPO | Admitting: Dermatology

## 2023-08-18 DIAGNOSIS — W908XXA Exposure to other nonionizing radiation, initial encounter: Secondary | ICD-10-CM

## 2023-08-18 DIAGNOSIS — L578 Other skin changes due to chronic exposure to nonionizing radiation: Secondary | ICD-10-CM

## 2023-08-18 DIAGNOSIS — I8393 Asymptomatic varicose veins of bilateral lower extremities: Secondary | ICD-10-CM

## 2023-08-18 DIAGNOSIS — D225 Melanocytic nevi of trunk: Secondary | ICD-10-CM

## 2023-08-18 DIAGNOSIS — L720 Epidermal cyst: Secondary | ICD-10-CM | POA: Diagnosis not present

## 2023-08-18 DIAGNOSIS — L910 Hypertrophic scar: Secondary | ICD-10-CM

## 2023-08-18 DIAGNOSIS — L821 Other seborrheic keratosis: Secondary | ICD-10-CM

## 2023-08-18 DIAGNOSIS — Z7189 Other specified counseling: Secondary | ICD-10-CM

## 2023-08-18 DIAGNOSIS — Z86018 Personal history of other benign neoplasm: Secondary | ICD-10-CM

## 2023-08-18 DIAGNOSIS — D229 Melanocytic nevi, unspecified: Secondary | ICD-10-CM

## 2023-08-18 DIAGNOSIS — D1801 Hemangioma of skin and subcutaneous tissue: Secondary | ICD-10-CM

## 2023-08-18 DIAGNOSIS — L719 Rosacea, unspecified: Secondary | ICD-10-CM | POA: Diagnosis not present

## 2023-08-18 DIAGNOSIS — L814 Other melanin hyperpigmentation: Secondary | ICD-10-CM

## 2023-08-18 DIAGNOSIS — D2261 Melanocytic nevi of right upper limb, including shoulder: Secondary | ICD-10-CM

## 2023-08-18 DIAGNOSIS — Z1283 Encounter for screening for malignant neoplasm of skin: Secondary | ICD-10-CM | POA: Diagnosis not present

## 2023-08-18 DIAGNOSIS — I781 Nevus, non-neoplastic: Secondary | ICD-10-CM

## 2023-08-18 NOTE — Progress Notes (Signed)
Follow-Up Visit   Subjective  Angela Park is a 62 y.o. female who presents for the following: Skin Cancer Screening and Full Body Skin Exam Keloid scars at chest and umbilicus would like to discuss treatment  The patient presents for Total-Body Skin Exam (TBSE) for skin cancer screening and mole check. The patient has spots, moles and lesions to be evaluated, some may be new or changing and the patient may have concern these could be cancer.    The following portions of the chart were reviewed this encounter and updated as appropriate: medications, allergies, medical history  Review of Systems:  No other skin or systemic complaints except as noted in HPI or Assessment and Plan.  Objective  Well appearing patient in no apparent distress; mood and affect are within normal limits.  A full examination was performed including scalp, head, eyes, ears, nose, lips, neck, chest, axillae, abdomen, back, buttocks, bilateral upper extremities, bilateral lower extremities, hands, feet, fingers, toes, fingernails, and toenails. All findings within normal limits unless otherwise noted below.   Relevant physical exam findings are noted in the Assessment and Plan.    Assessment & Plan   SKIN CANCER SCREENING PERFORMED TODAY.  ACTINIC DAMAGE - Chronic condition, secondary to cumulative UV/sun exposure - diffuse scaly erythematous macules with underlying dyspigmentation - Recommend daily broad spectrum sunscreen SPF 30+ to sun-exposed areas, reapply every 2 hours as needed.  - Staying in the shade or wearing long sleeves, sun glasses (UVA+UVB protection) and wide brim hats (4-inch brim around the entire circumference of the hat) are also recommended for sun protection.  - Call for new or changing lesions.  MILIA   Exam: tiny erythematous firm white papule at periocular   Discussed this is a type of cyst. Benign-appearing. Sometimes these will clear with OTC adapalene/Differin 0.1% cream QHS  or retinol.   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 Condition is improving with treatment but not currently at goal.   Rosacea is a chronic progressive skin condition usually affecting the face of adults, causing redness and/or acne bumps. It is treatable but not curable. It sometimes affects the eyes (ocular rosacea) as well. It may respond to topical and/or systemic medication and can flare with stress, sun exposure, alcohol, exercise, topical steroids (including hydrocortisone/cortisone 10) and some foods.  Daily application of broad spectrum spf 30+ sunscreen to face is recommended to reduce flares.  Patient denies grittiness of the eyes  Treatment Plan Use otc treatment  Recommend sunscreen and moisturizer  Counseling for BBL / IPL / Laser and Coordination of Care Discussed the treatment option of Broad Band Light (BBL) /Intense Pulsed Light (IPL)/ Laser for skin discoloration, including brown spots and redness.  Typically we recommend at least 1-3 treatment sessions about 5-8 weeks apart for best results.  Cannot have tanned skin when BBL performed, and regular use of sunscreen/photoprotection is advised after the procedure to help maintain results. The patient's condition may also require "maintenance treatments" in the future.  The fee for BBL / laser treatments is $350 per treatment session for the whole face.  A fee can be quoted for other parts of the body.  Insurance typically does not pay for BBL/laser treatments and therefore the fee is an out-of-pocket cost. Recommend prophylactic valtrex treatment. Once scheduled for procedure, will send Rx in prior to patient's appointment.    LENTIGINES, SEBORRHEIC KERATOSES, HEMANGIOMAS - Benign normal  skin lesions - Benign-appearing - Call for any changes  MELANOCYTIC NEVI - Tan-brown and/or pink-flesh-colored symmetric macules and  papules - Benign appearing on exam today - Observation - Call clinic for new or changing moles - Recommend daily use of broad spectrum spf 30+ sunscreen to sun-exposed areas.   Nevus  Exam:  R shoulder  5.0 mm speckled brown macule  (vs lentigo)  right abdomen 4 x 3 mm brown papule two toned  Benign-appearing. Stable compared to previous visit. Observation.  Call clinic for new or changing moles.  Recommend daily use of broad spectrum spf 30+ sunscreen to sun-exposed areas.    KELOID Exam: pink firm nodules at right breast  and firm pink nodule umbilicus  Secondary to blemish many years ago (breast). Improved in the past with ILK injections. Discussed topical vs ILK injections vs both   ILK injection to right breast today x 2 and umbilicus at thickened scars Continue Strataderm scar treatment QD/BID  Treatment Plan:  ILK today Location: right breast x 2, umbilicus   Informed Consent: Discussed risks (infection, pain, bleeding, bruising, thinning of the skin, loss of skin pigment, lack of resolution, and recurrence of lesion) and benefits of the procedure, as well as the alternatives. Informed consent was obtained. Preparation: The area was prepared a standard fashion.  Anesthesia: none  Procedure Details: An intralesional injection was performed with Kenalog 40 mg/cc. 0.2 cc in total were injected at umbilicus and 0.1 cc in total were injected to right breast x 2 lesions  Total number of injections: >7  Plan: The patient was instructed on post-op care. Recommend OTC analgesia as needed for pain.   Lot [id number] NDC: 0003-0293-28 Exp: 12/2024  Varicose Veins/Spider Veins - Dilated blue, purple or red veins at the lower extremities - Reassured - Smaller vessels can be treated by sclerotherapy (a procedure to inject a medicine into the veins to make them disappear) if desired, but the treatment is not covered by insurance. Larger vessels may be covered if symptomatic and we would  refer to vascular surgeon if treatment desired.   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  Call if any new or changing lesions are noted between office visits   Return in about 1 year (around 08/17/2024) for TBSE.  I, Asher Muir, CMA, am acting as scribe for Willeen Niece, MD.   Documentation: I have reviewed the above documentation for accuracy and completeness, and I agree with the above.  Willeen Niece, MD

## 2023-08-18 NOTE — Patient Instructions (Addendum)
For Keloid scar laser options   Recommend  Normand Sloop, M.D. [address] [place], [place] [address] [phone]  [email or web address]  Or   [facility]  Phone: [phone] located at [address] [place] [address] [place], [place] [address]       Seborrheic Keratosis  What causes seborrheic keratoses? Seborrheic keratoses are harmless, common skin growths that first appear during adult life.  As time goes by, more growths appear.  Some people may develop a large number of them.  Seborrheic keratoses appear on both covered and uncovered body parts.  They are not caused by sunlight.  The tendency to develop seborrheic keratoses can be inherited.  They vary in color from skin-colored to gray, brown, or even black.  They can be either smooth or have a rough, warty surface.   Seborrheic keratoses are superficial and look as if they were stuck on the skin.  Under the microscope this type of keratosis looks like layers upon layers of skin.  That is why at times the top layer may seem to fall off, but the rest of the growth remains and re-grows.    Treatment Seborrheic keratoses do not need to be treated, but can easily be removed in the office.  Seborrheic keratoses often cause symptoms when they rub on clothing or jewelry.  Lesions can be in the way of shaving.  If they become inflamed, they can cause itching, soreness, or burning.  Removal of a seborrheic keratosis can be accomplished by freezing, burning, or surgery. If any spot bleeds, scabs, or grows rapidly, please return to have it checked, as these can be an indication of a skin cancer.   Melanoma ABCDEs  Melanoma is the most dangerous type of skin cancer, and is the leading cause of death from skin disease.  You are more likely to develop melanoma if you: Have light-colored skin, light-colored eyes, or red or blond hair Spend a lot of time in the sun Tan regularly, either  outdoors or in a tanning bed Have had blistering sunburns, especially during childhood Have a close family member who has had a melanoma Have atypical moles or large birthmarks  Early detection of melanoma is key since treatment is typically straightforward and cure rates are extremely high if we catch it early.   The first sign of melanoma is often a change in a mole or a new dark spot.  The ABCDE system is a way of remembering the signs of melanoma.  A for asymmetry:  The two halves do not match. B for border:  The edges of the growth are irregular. C for color:  A mixture of colors are present instead of an even brown color. D for diameter:  Melanomas are usually (but not always) greater than 6mm - the size of a pencil eraser. E for evolution:  The spot keeps changing in size, shape, and color.  Please check your skin once per month between visits. You can use a small mirror in front and a large mirror behind you to keep an eye on the back side or your body.   If you see any new or changing lesions before your next follow-up, please call to schedule a visit.  Please continue daily skin protection including broad spectrum sunscreen SPF 30+ to sun-exposed areas, reapplying every 2 hours as needed when you're outdoors.   Staying in the shade or wearing long sleeves, sun glasses (UVA+UVB protection) and  wide brim hats (4-inch brim around the entire circumference of the hat) are also recommended for sun protection.    Due to recent changes in healthcare laws, you may see results of your pathology and/or laboratory studies on MyChart before the doctors have had a chance to review them. We understand that in some cases there may be results that are confusing or concerning to you. Please understand that not all results are received at the same time and often the doctors may need to interpret multiple results in order to provide you with the best plan of care or course of treatment. Therefore, we  ask that you please give [place] 2 business days to thoroughly review all your results before contacting the office for clarification. Should we see a critical lab result, you will be contacted sooner.   If You Need Anything After Your Visit  If you have any questions or concerns for your doctor, please call our main line at [phone] and press option 4 to reach your doctor's medical assistant. If no one answers, please leave a voicemail as directed and we will return your call as soon as possible. Messages left after 4 pm will be answered the following business day.   You may also send [place] a message via MyChart. We typically respond to MyChart messages within 1-2 business days.  For prescription refills, please ask your pharmacy to contact our office. Our fax number is [phone].  If you have an urgent issue when the clinic is closed that cannot wait until the next business day, you can page your doctor at the number below.    Please note that while we do our best to be available for urgent issues outside of office hours, we are not available 24/7.   If you have an urgent issue and are unable to reach [place], you may choose to seek medical care at your doctor's office, retail clinic, urgent care center, or emergency room.  If you have a medical emergency, please immediately call 911 or go to the emergency department.  Pager Numbers  - Dr. Gwen Pounds: [phone]  - Dr. Roseanne Reno: [phone]  - Dr. Katrinka Blazing: [phone]   In the event of inclement weather, please call our main line at [phone] for an update on the status of any delays or closures.  Dermatology Medication Tips: Please keep the boxes that topical medications come in in order to help keep track of the instructions about where and how to use these. Pharmacies typically print the medication instructions only on the boxes and not directly on the medication tubes.   If your medication is too expensive, please contact our office  at [phone] option 4 or send [place] a message through MyChart.   We are unable to tell what your co-pay for medications will be in advance as this is different depending on your insurance coverage. However, we may be able to find a substitute medication at lower cost or fill out paperwork to get insurance to cover a needed medication.   If a prior authorization is required to get your medication covered by your insurance company, please allow [place] 1-2 business days to complete this process.  Drug prices often vary depending on where the prescription is filled and some pharmacies may offer cheaper prices.  The website www.goodrx.com contains coupons for medications through different pharmacies. The prices here do not account for what the cost may be with help from insurance (it may be cheaper with your insurance), but the website can  give you the price if you did not use any insurance.  - You can print the associated coupon and take it with your prescription to the pharmacy.  - You may also stop by our office during regular business hours and pick up a GoodRx coupon card.  - If you need your prescription sent electronically to a different pharmacy, notify our office through [facility] or by phone at [phone] option 4.     Si Usted Necesita Algo Despus de Su Visita  Tambin puede enviarnos un mensaje a travs de Clinical cytogeneticist. Por lo general respondemos a los mensajes de MyChart en el transcurso de 1 a 2 das hbiles.  Para renovar recetas, por favor pida a su farmacia que se ponga en contacto con nuestra oficina. Annie Sable de fax es [place] [phone].  Si tiene un asunto urgente cuando la clnica est cerrada y que no puede esperar hasta el siguiente da hbil, puede llamar/localizar a su doctor(a) al nmero que aparece a continuacin.   Por favor, tenga en cuenta que aunque hacemos todo lo posible para estar disponibles para asuntos urgentes fuera del horario de [place], no estamos  disponibles las 24 horas del da, los 7 [address]  [address] de la [place].   Si tiene un problema urgente y no puede comunicarse con nosotros, puede optar por buscar atencin mdica  en el consultorio de su doctor(a), en una clnica privada, en un centro de atencin urgente o en una sala de emergencias.  Si tiene Engineer, drilling, por favor llame inmediatamente al 911 o vaya a la sala de emergencias.  Nmeros de bper  - Dr. Gwen Pounds: [phone]  - Dra. Roseanne Reno: [phone]  - Dr. Katrinka Blazing: [phone]   En caso de inclemencias del tiempo, por favor llame a Lacy Duverney principal al [phone] para una actualizacin sobre el [place] de cualquier retraso o cierre.  Consejos para la medicacin en dermatologa: Por favor, guarde las cajas en las que vienen los medicamentos de uso tpico para ayudarle a seguir las instrucciones sobre dnde y cmo usarlos. Las farmacias generalmente imprimen las instrucciones del medicamento slo en las cajas y no directamente en los tubos del [place].   Si su medicamento es muy caro, por favor, pngase en contacto con Rolm Gala llamando al [phone] y presione la opcin 4 o envenos un mensaje a travs de Clinical cytogeneticist.   No podemos decirle cul ser su copago por los medicamentos por adelantado ya que esto es diferente dependiendo de la cobertura de su seguro. Sin embargo, es posible que podamos encontrar un medicamento sustituto a Audiological scientist un formulario para que el seguro cubra el medicamento que se considera necesario.   Si se requiere una autorizacin previa para que su compaa de seguros [place] su medicamento, por favor permtanos de 1 a 2 das hbiles para completar [address]  Los precios de los medicamentos varan con frecuencia dependiendo del Environmental consultant de dnde se surte la receta y alguna farmacias pueden ofrecer precios ms baratos.  El sitio web www.goodrx.com tiene cupones para medicamentos de Health and safety inspector. Los precios aqu no  tienen en cuenta lo que podra costar con la ayuda del seguro (puede ser ms barato con su seguro), pero el sitio web puede darle el precio si no utiliz Tourist information centre manager.  - Puede imprimir el cupn correspondiente y llevarlo con su receta a la farmacia.  - Tambin puede pasar por nuestra oficina durante el horario de atencin regular y Education officer, museum una tarjeta de cupones de GoodRx.  -  Si necesita que su receta se enve electrnicamente a Psychiatrist, informe a nuestra oficina a travs de MyChart de  o por telfono llamando al [phone] y presione la opcin 4.

## 2023-10-22 ENCOUNTER — Telehealth: Payer: Self-pay

## 2023-10-22 ENCOUNTER — Ambulatory Visit: Payer: 59 | Admitting: Nurse Practitioner

## 2023-10-22 VITALS — BP 120/82 | HR 73 | Temp 98.1°F | Ht 64.0 in | Wt 162.8 lb

## 2023-10-22 DIAGNOSIS — E039 Hypothyroidism, unspecified: Secondary | ICD-10-CM | POA: Diagnosis not present

## 2023-10-22 DIAGNOSIS — F419 Anxiety disorder, unspecified: Secondary | ICD-10-CM | POA: Diagnosis not present

## 2023-10-22 DIAGNOSIS — N951 Menopausal and female climacteric states: Secondary | ICD-10-CM | POA: Diagnosis not present

## 2023-10-22 DIAGNOSIS — K219 Gastro-esophageal reflux disease without esophagitis: Secondary | ICD-10-CM

## 2023-10-22 DIAGNOSIS — Z23 Encounter for immunization: Secondary | ICD-10-CM | POA: Diagnosis not present

## 2023-10-22 DIAGNOSIS — E7800 Pure hypercholesterolemia, unspecified: Secondary | ICD-10-CM | POA: Diagnosis not present

## 2023-10-22 DIAGNOSIS — F32A Depression, unspecified: Secondary | ICD-10-CM

## 2023-10-22 DIAGNOSIS — R7303 Prediabetes: Secondary | ICD-10-CM | POA: Diagnosis not present

## 2023-10-22 LAB — COMPREHENSIVE METABOLIC PANEL
ALT: 24 U/L (ref 0–35)
AST: 20 U/L (ref 0–37)
Albumin: 4.4 g/dL (ref 3.5–5.2)
Alkaline Phosphatase: 76 U/L (ref 39–117)
BUN: 17 mg/dL (ref 6–23)
CO2: 29 meq/L (ref 19–32)
Calcium: 9.3 mg/dL (ref 8.4–10.5)
Chloride: 102 meq/L (ref 96–112)
Creatinine, Ser: 0.75 mg/dL (ref 0.40–1.20)
GFR: 84.13 mL/min (ref >60.00)
Glucose, Bld: 91 mg/dL (ref 70–99)
Potassium: 3.9 meq/L (ref 3.5–5.1)
Sodium: 139 meq/L (ref 135–145)
Total Bilirubin: 0.5 mg/dL (ref 0.2–1.2)
Total Protein: 7.6 g/dL (ref 6.0–8.3)

## 2023-10-22 LAB — LIPID PANEL
Cholesterol: 223 mg/dL — ABNORMAL HIGH (ref 0–200)
HDL: 50.3 mg/dL (ref >39.00)
LDL Cholesterol: 150 mg/dL — ABNORMAL HIGH (ref 0–99)
NonHDL: 172.2
Total CHOL/HDL Ratio: 4
Triglycerides: 109 mg/dL (ref 0.0–149.0)
VLDL: 21.8 mg/dL (ref 0.0–40.0)

## 2023-10-22 LAB — HEMOGLOBIN A1C: Hgb A1c MFr Bld: 6.1 % (ref 4.6–6.5)

## 2023-10-22 LAB — TSH: TSH: 2.77 u[IU]/mL (ref 0.35–5.50)

## 2023-10-22 NOTE — Telephone Encounter (Signed)
PA needed for [employer] is updated

## 2023-10-22 NOTE — Progress Notes (Unsigned)
  Bethanie Dicker, NP-C Phone: [phone]  Angela Park is a 63 y.o. female who presents today for follow up.   ***  Social History   Tobacco Use  Smoking Status Never  Smokeless Tobacco Never    Current Outpatient Medications on File Prior to Visit  Medication Sig Dispense Refill  . ALPRAZolam (XANAX) 0.5 MG tablet Take 1 tablet (0.5 mg total) by mouth daily as needed for anxiety. 30 tablet 2  . Cholecalciferol (VITAMIN D-1000 MAX ST) 25 MCG (1000 UT) tablet Take by mouth.    . fexofenadine (ALLEGRA) 180 MG tablet Take by mouth.    . Fezolinetant (VEOZAH) 45 MG TABS Take 1 tablet (45 mg total) by mouth daily. 90 tablet 3  . levothyroxine (SYNTHROID) 25 MCG tablet Take 1 tablet (25 mcg total) by mouth daily before breakfast. 90 tablet 3  . Multiple Vitamin (MULTI-VITAMIN) tablet Take 1 tablet by mouth daily.    Marland Kitchen omeprazole (PRILOSEC) 40 MG capsule Take 1 capsule (40 mg total) by mouth daily. 90 capsule 3  . PARoxetine (PAXIL) 10 MG tablet Take 1 tablet (10 mg total) by mouth daily. 90 tablet 1   No current facility-administered medications on file prior to visit.     ROS see history of present illness  Objective  Physical Exam There were no vitals filed for this visit.  BP Readings from Last 3 Encounters:  04/21/23 118/78  07/10/21 118/81  07/26/20 110/76   Wt Readings from Last 3 Encounters:  04/21/23 158 lb 12.8 oz (72 kg)  07/26/20 147 lb (66.7 kg)  11/22/03 138 lb (62.6 kg)    Physical Exam Constitutional:      General: She is not in acute distress.    Appearance: Normal appearance.  HENT:     Head: Normocephalic.  Cardiovascular:     Rate and Rhythm: Normal rate and regular rhythm.     Heart sounds: Normal heart sounds.  Pulmonary:     Effort: Pulmonary effort is normal.     Breath sounds: Normal breath sounds.  Skin:    General: Skin is warm and dry.  Neurological:     General: No focal deficit present.     Mental Status: She is alert.   Psychiatric:        Mood and Affect: Mood normal.        Behavior: Behavior normal.     Assessment/Plan: Please see individual problem list.  There are no diagnoses linked to this encounter.   Health Maintenance: ***  No follow-ups on file.   Bethanie Dicker, NP-C [facility]

## 2023-10-23 ENCOUNTER — Encounter: Payer: Self-pay | Admitting: Nurse Practitioner

## 2023-10-26 ENCOUNTER — Other Ambulatory Visit (HOSPITAL_COMMUNITY): Payer: Self-pay

## 2023-10-26 ENCOUNTER — Telehealth: Payer: Self-pay

## 2023-10-26 NOTE — Telephone Encounter (Signed)
Pharmacy Patient Advocate Encounter   Received notification from Pt Calls Messages that prior authorization for Veozah 45MG  tablets is required/requested.   Insurance verification completed.   The patient is insured through [facility] .   Per test claim: PA required; PA submitted to above mentioned insurance via CoverMyMeds Key/confirmation #/EOC [id number] Status is pending

## 2023-10-26 NOTE — Telephone Encounter (Signed)
PA request has been Submitted. New Encounter created for follow up. For additional info see Pharmacy Prior Auth telephone encounter from 10/26/2023.

## 2023-10-28 ENCOUNTER — Encounter: Payer: Self-pay | Admitting: Nurse Practitioner

## 2023-10-28 MED ORDER — ALPRAZOLAM 0.5 MG PO TABS
0.5000 mg | ORAL_TABLET | Freq: Every day | ORAL | 2 refills | Status: DC | PRN
Start: 2023-10-28 — End: 2024-04-20

## 2023-10-28 MED ORDER — PAROXETINE HCL 10 MG PO TABS
10.0000 mg | ORAL_TABLET | Freq: Every day | ORAL | 3 refills | Status: DC
Start: 2023-10-28 — End: 2024-04-20

## 2023-10-28 NOTE — Assessment & Plan Note (Signed)
 Initiated lifestyle modifications including diet changes and fish oil supplementation. No symptoms of cardiovascular disease. Check lipid panel today.

## 2023-10-28 NOTE — Assessment & Plan Note (Signed)
 Transitioned from Lexapro  to Paxil  with good tolerance and emotional response. Continue Paxil  10 mg daily for mood stabilization and Xanax  0.5 mg daily as needed for anxiety. PDMP reviewed. Encouraged to contact if worsening symptoms, unusual behavior changes or suicidal thoughts occur.

## 2023-10-28 NOTE — Assessment & Plan Note (Signed)
 Last A1c- 5.9. Encourage continued healthy diet and regular exercise. Will check A1c today.

## 2023-10-28 NOTE — Assessment & Plan Note (Signed)
 Persistent hot flashes continue despite non-hormonal treatment with Paxil . She was previously on Estrace daily. Discussed reattempting prior authorization for Veozah  with new insurance. Resubmit prior authorization.

## 2023-10-28 NOTE — Assessment & Plan Note (Signed)
 Well controlled on Omeprazole  40 mg daily. Continue current medication and dietary modifications.

## 2023-10-28 NOTE — Assessment & Plan Note (Signed)
 Levothyroxine  increased after last visit to 25 mcg, full tablet, daily with no side effects or symptoms of hyperthyroidism. Check thyroid  function tests today.

## 2023-10-28 NOTE — Telephone Encounter (Signed)
 Pharmacy Patient Advocate Encounter  Received notification from [facility] that Prior Authorization for Veozah  45MG  tablets has been DENIED.  Full denial letter will be uploaded to the media tab. See denial reason below.   PA #/Case ID/Reference #: [id number]

## 2023-10-29 NOTE — Telephone Encounter (Signed)
 This was noted with supporting chart notes in original PA to insurance.

## 2023-11-02 ENCOUNTER — Ambulatory Visit: Payer: 59 | Admitting: Family Medicine

## 2023-11-02 ENCOUNTER — Encounter: Payer: Self-pay | Admitting: Family Medicine

## 2023-11-02 VITALS — BP 134/80 | HR 83 | Temp 98.3°F | Resp 18 | Ht 64.0 in | Wt 162.5 lb

## 2023-11-02 DIAGNOSIS — R109 Unspecified abdominal pain: Secondary | ICD-10-CM | POA: Diagnosis not present

## 2023-11-02 DIAGNOSIS — M545 Low back pain, unspecified: Secondary | ICD-10-CM | POA: Diagnosis not present

## 2023-11-02 DIAGNOSIS — N951 Menopausal and female climacteric states: Secondary | ICD-10-CM | POA: Diagnosis not present

## 2023-11-02 DIAGNOSIS — N3010 Interstitial cystitis (chronic) without hematuria: Secondary | ICD-10-CM | POA: Diagnosis not present

## 2023-11-02 LAB — URINALYSIS, ROUTINE W REFLEX MICROSCOPIC
Bilirubin Urine: NEGATIVE
Ketones, ur: NEGATIVE
Leukocytes,Ua: NEGATIVE
Nitrite: NEGATIVE
Specific Gravity, Urine: 1.02 (ref 1.000–1.030)
Total Protein, Urine: NEGATIVE
Urine Glucose: NEGATIVE
Urobilinogen, UA: 0.2 (ref 0.0–1.0)
pH: 6 (ref 5.0–8.0)

## 2023-11-02 LAB — POC URINALSYSI DIPSTICK (AUTOMATED)
Bilirubin, UA: NEGATIVE
Glucose, UA: NEGATIVE
Ketones, UA: NEGATIVE
Leukocytes, UA: NEGATIVE
Nitrite, UA: NEGATIVE
Protein, UA: NEGATIVE
Spec Grav, UA: 1.02 (ref 1.010–1.025)
Urobilinogen, UA: 0.2 U/dL
pH, UA: 5.5 (ref 5.0–8.0)

## 2023-11-02 MED ORDER — TAMSULOSIN HCL 0.4 MG PO CAPS: 0.4000 mg | ORAL_CAPSULE | Freq: Every day | ORAL | 0 refills | Status: DC

## 2023-11-02 NOTE — Progress Notes (Signed)
 SUBJECTIVE:   Chief Complaint  Patient presents with   Back Pain    From lower back to front of the stomach.   Urinary Tract Infection    Burning in vagina after urination. Started this weekend   HPI Presents for acute visit  Discussed the use of AI scribe software for clinical note transcription with the patient, who gave verbal consent to proceed.  History of Present Illness Angela Park is a 63 year old female with a history of kidney stone and interstitial cystitis who presents with intermittent back and abdominal pain.  She experiences intermittent back pain that wraps around to the front, initially occurring a few weeks ago at 4 AM. The pain is described as high and upper back pain. She took three or four aspirin and two Tums, which provided relief. The pain recurred later that day, and the same treatment was effective. She was symptom-free until this past weekend, approximately three weeks later, when the pain returned. She again took aspirin, which helped, but the pain recurred later in the day. No fever, blood in urine, or pain with urination, although there is tenderness after urination and a sensation of incomplete bladder emptying.  She has a history of interstitial cystitis, managed with over-the-counter natural remedies like marshmallow root and aloe vera. She previously took Elmiron but stopped due to a recall. The current pain is different from her interstitial cystitis pain, as it includes back pain that wraps around to the front.  She is experiencing menopausal symptoms, including night sweats and hot flashes. She was previously on estrogen but was switched to Paxil , which has not alleviated her symptoms. She has tried over-the-counter Estroven without success and wants to try Veozah  for her symptoms.  She has a history of thyroid  issues, previously managed with a low dose of medication, but she is uncertain about her current thyroid  status. She mentions weight gain and  concerns about her thyroid  levels.  She has a history of keloid formation, with a notable keloid at her belly button from a past laparoscopic surgery.      PERTINENT PMH / PSH: As above  OBJECTIVE:  BP 134/80   Pulse 83   Temp 98.3 F (36.8 C)   Resp 18   Ht 5\' 4"  (1.626 m)   Wt 162 lb 8 oz (73.7 kg)   SpO2 96%   BMI 27.89 kg/m    Physical Exam Vitals reviewed.  Constitutional:      General: She is not in acute distress.    Appearance: Normal appearance. She is not ill-appearing, toxic-appearing or diaphoretic.  Eyes:     General:        Right eye: No discharge.        Left eye: No discharge.     Conjunctiva/sclera: Conjunctivae normal.  Cardiovascular:     Rate and Rhythm: Normal rate.  Pulmonary:     Effort: Pulmonary effort is normal.  Abdominal:     General: Bowel sounds are normal.     Palpations: Abdomen is soft. There is no hepatomegaly or splenomegaly.     Tenderness: There is abdominal tenderness in the suprapubic area and left upper quadrant. There is no right CVA tenderness or left CVA tenderness.  Musculoskeletal:        General: Normal range of motion.  Skin:    General: Skin is warm and dry.  Neurological:     General: No focal deficit present.     Mental Status: She is  alert and oriented to person, place, and time. Mental status is at baseline.  Psychiatric:        Mood and Affect: Mood normal.        Behavior: Behavior normal.        Thought Content: Thought content normal.        Judgment: Judgment normal.           11/02/2023    1:16 PM 10/22/2023    8:43 AM 04/21/2023    9:09 AM  Depression screen PHQ 2/9  Decreased Interest 0 0 0  Down, Depressed, Hopeless 0 0 0  PHQ - 2 Score 0 0 0  Altered sleeping 2 2 0  Tired, decreased energy 0 0 0  Change in appetite 0 0 0  Feeling bad or failure about yourself  0 0 0  Trouble concentrating 0 0 0  Moving slowly or fidgety/restless 0 0 0  Suicidal thoughts 0 0 0  PHQ-9 Score 2 2 0   Difficult doing work/chores Not difficult at all Not difficult at all Not difficult at all      11/02/2023    1:16 PM 10/22/2023    8:43 AM 04/21/2023    9:09 AM  GAD 7 : Generalized Anxiety Score  Nervous, Anxious, on Edge 1 0 0  Control/stop worrying 0 0 0  Worry too much - different things 0 0 0  Trouble relaxing 0 0 0  Restless 0 0 0  Easily annoyed or irritable 0 0 0  Afraid - awful might happen 0 0 0  Total GAD 7 Score 1 0 0  Anxiety Difficulty Not difficult at all Not difficult at all Not difficult at all    ASSESSMENT/PLAN:  Flank pain Assessment & Plan: Intermittent pain in the upper abdomen and back, wrapping around to the front. No fever, nausea, vomiting, or blood in urine. History of kidney stone and interstitial cystitis. Tenderness in the lower abdomen on examination. -POC urine positive for large amount blood -Send urine for culture and urinalysis -CT renal study -Strain urine -Start Flomax  0.4 mg daily -Consider referral to Urology  Orders: -     POCT Urinalysis Dipstick (Automated) -     Urine Culture -     CT RENAL STONE STUDY -     Urinalysis, Routine w reflex microscopic -     CBC with Differential/Platelet -     Comprehensive metabolic panel -     Tamsulosin  HCl; Take 1 capsule (0.4 mg total) by mouth daily.  Dispense: 30 capsule; Refill: 0  Menopausal syndrome (hot flashes) Assessment & Plan: Hot flashes and night sweats. Previously on estrogen, now on Paxil  and over-the-counter Estroven, with no relief. -Consider consultation with Marykay Snipes MD for hormonal therapy. -Consider [place] pharmacy for possible cost-effective medication options.   Chronic interstitial cystitis Assessment & Plan: Previously seen Urology in [place]  Consider referral to Urology reevaluation if symptoms worsen     PDMP reviewed  Return if symptoms worsen or fail to improve, for PCP.  Valli Gaw, MD

## 2023-11-02 NOTE — Assessment & Plan Note (Signed)
 Intermittent pain in the upper abdomen and back, wrapping around to the front. No fever, nausea, vomiting, or blood in urine. History of kidney stone and interstitial cystitis. Tenderness in the lower abdomen on examination. -POC urine positive for large amount blood -Send urine for culture and urinalysis -CT renal study -Strain urine -Start Flomax  0.4 mg daily -Consider referral to Urology

## 2023-11-02 NOTE — Patient Instructions (Addendum)
 It was a pleasure meeting you today. Thank you for allowing me to take part in your health care.  Our goals for today as we discussed include:  Start Flomax  0.4 mg daily Increase water intake daily, 32-64 oz at least Strain urine Tylenol 500 mg every 8 hours for pain.    Imaging has been ordered for your kidneys.  They will call you with an appointment.  We will get some labs today.  If they are abnormal or we need to do something about them, I will call you.  If they are normal, I will send you a message on MyChart (if it is active) or a letter in the mail.  If you don't hear from us  in 2 weeks, please call the office at the number below.   If symptoms worsen follow up with MD  Recommend Marykay Snipes MD for discussion of symptoms of menopause    This is a list of the screening recommended for you and due dates:  Health Maintenance  Topic Date Due   COVID-19 Vaccine (1) Never done   HIV Screening  Never done   Hepatitis C Screening  Never done   Pap with HPV screening  09/19/2008   Mammogram  04/01/2024   DTaP/Tdap/Td vaccine (4 - Td or Tdap) 11/14/2024   Colon Cancer Screening  11/13/2032   Flu Shot  Completed   Zoster (Shingles) Vaccine  Completed   HPV Vaccine  Aged Out      If you have any questions or concerns, please do not hesitate to call the office at [phone].  I look forward to our next visit and until then take care and stay safe.  Regards,   Valli Gaw, MD   [facility]

## 2023-11-02 NOTE — Assessment & Plan Note (Signed)
 Previously seen Urology in [place]  Consider referral to Urology reevaluation if symptoms worsen

## 2023-11-02 NOTE — Assessment & Plan Note (Signed)
 Hot flashes and night sweats. Previously on estrogen, now on Paxil  and over-the-counter Estroven, with no relief. -Consider consultation with Marykay Snipes MD for hormonal therapy. -Consider [place] pharmacy for possible cost-effective medication options.

## 2023-11-03 ENCOUNTER — Other Ambulatory Visit: Payer: Self-pay | Admitting: Family Medicine

## 2023-11-03 ENCOUNTER — Ambulatory Visit
Admission: RE | Admit: 2023-11-03 | Discharge: 2023-11-03 | Disposition: A | Discharge status: Home / Self Care | Payer: 59 | Source: Ambulatory Visit | Attending: Family Medicine

## 2023-11-03 DIAGNOSIS — N200 Calculus of kidney: Secondary | ICD-10-CM | POA: Diagnosis not present

## 2023-11-03 DIAGNOSIS — N8110 Cystocele, unspecified: Secondary | ICD-10-CM | POA: Diagnosis not present

## 2023-11-03 LAB — COMPREHENSIVE METABOLIC PANEL
ALT: 23 U/L (ref 0–35)
AST: 20 U/L (ref 0–37)
Albumin: 4.5 g/dL (ref 3.5–5.2)
Alkaline Phosphatase: 73 U/L (ref 39–117)
BUN: 15 mg/dL (ref 6–23)
CO2: 29 meq/L (ref 19–32)
Calcium: 9.5 mg/dL (ref 8.4–10.5)
Chloride: 100 meq/L (ref 96–112)
Creatinine, Ser: 0.74 mg/dL (ref 0.40–1.20)
GFR: 85.47 mL/min (ref >60.00)
Glucose, Bld: 85 mg/dL (ref 70–99)
Potassium: 4.4 meq/L (ref 3.5–5.1)
Sodium: 138 meq/L (ref 135–145)
Total Bilirubin: 0.4 mg/dL (ref 0.2–1.2)
Total Protein: 8.1 g/dL (ref 6.0–8.3)

## 2023-11-03 LAB — CBC WITH DIFFERENTIAL/PLATELET
Basophils Absolute: 0.1 10*3/uL (ref 0.0–0.1)
Basophils Relative: 1 % (ref 0.0–3.0)
Eosinophils Absolute: 0.1 10*3/uL (ref 0.0–0.7)
Eosinophils Relative: 2 % (ref 0.0–5.0)
HCT: 43.1 % (ref 36.0–46.0)
Hemoglobin: 14.2 g/dL (ref 12.0–15.0)
Lymphocytes Relative: 32.5 % (ref 12.0–46.0)
Lymphs Abs: 1.9 10*3/uL (ref 0.7–4.0)
MCHC: 32.8 g/dL (ref 30.0–36.0)
MCV: 88.4 fL (ref 78.0–100.0)
Monocytes Absolute: 0.4 10*3/uL (ref 0.1–1.0)
Monocytes Relative: 6.7 % (ref 3.0–12.0)
Neutro Abs: 3.4 10*3/uL (ref 1.4–7.7)
Neutrophils Relative %: 57.8 % (ref 43.0–77.0)
Platelets: 319 10*3/uL (ref 150.0–400.0)
RBC: 4.87 Mil/uL (ref 3.87–5.11)
RDW: 13.7 % (ref 11.5–15.5)
WBC: 5.9 10*3/uL (ref 4.0–10.5)

## 2023-11-03 LAB — URINE CULTURE
MICRO NUMBER:: 16063509
Result:: NO GROWTH
SPECIMEN QUALITY:: ADEQUATE

## 2023-11-05 ENCOUNTER — Ambulatory Visit: Payer: 59 | Admitting: Family Medicine

## 2023-11-05 ENCOUNTER — Encounter: Payer: Self-pay | Admitting: Family Medicine

## 2023-11-05 VITALS — BP 130/82 | HR 83 | Temp 98.0°F | Resp 18 | Ht 64.0 in | Wt 162.0 lb

## 2023-11-05 DIAGNOSIS — N200 Calculus of kidney: Secondary | ICD-10-CM

## 2023-11-05 DIAGNOSIS — N3010 Interstitial cystitis (chronic) without hematuria: Secondary | ICD-10-CM

## 2023-11-05 DIAGNOSIS — N951 Menopausal and female climacteric states: Secondary | ICD-10-CM

## 2023-11-05 DIAGNOSIS — R911 Solitary pulmonary nodule: Secondary | ICD-10-CM

## 2023-11-05 NOTE — Patient Instructions (Signed)
It was a pleasure meeting you today. Thank you for allowing me to take part in your health care.  Our goals for today as we discussed include:  Continue Flomax 0.4 mg daily for 30 day Strain urine  If pain continues can continue Flomax and recommend Urology referral   Follow up as needed   This is a list of the screening recommended for you and due dates:  Health Maintenance  Topic Date Due   COVID-19 Vaccine (1) Never done   HIV Screening  Never done   Hepatitis C Screening  Never done   Pap with HPV screening  09/19/2008   Mammogram  04/01/2024   DTaP/Tdap/Td vaccine (4 - Td or Tdap) 11/14/2024   Colon Cancer Screening  11/13/2032   Flu Shot  Completed   Zoster (Shingles) Vaccine  Completed   HPV Vaccine  Aged Out      If you have any questions or concerns, please do not hesitate to call the office at [phone].  I look forward to our next visit and until then take care and stay safe.  Regards,   Dana Allan, MD   [facility]

## 2023-11-05 NOTE — Progress Notes (Signed)
 SUBJECTIVE:   Chief Complaint  Patient presents with   Abnormal Lab    CT   HPI Presents for follow up discussion of CT results  Discussed the use of AI scribe software for clinical note transcription with the patient, who gave verbal consent to proceed.  History of Present Illness Angela Park is a 63 year old female who presents with a non-obstructing kidney stone in the left kidney.  She has a 6 mm non-obstructing kidney stone in the left kidney, causing intermittent left-sided pain. She is currently asymptomatic but is concerned about potential future pain, particularly during travel.  She has a history of interstitial cystitis and has been informed of calcification in the bladder area, which may be related to this condition. She underwent a hysterectomy in the past, resulting in some prolapse. No history of smoking or exposure to tuberculosis, and no family history of autoimmune diseases.  A recent CT scan revealed calcified lymph nodes, possibly indicating old granulomatous disease. She has no symptoms such as shortness of breath or chest pain. No history of smoking, asthma, or COPD, and no family history of similar conditions. The scan also showed diverticula in the colon, but no inflammation is present.     PERTINENT PMH / PSH: As above  OBJECTIVE:  BP 130/82   Pulse 83   Temp 98 F (36.7 C)   Resp 18   Ht 5\' 4"  (1.626 m)   Wt 162 lb (73.5 kg)   SpO2 97%   BMI 27.81 kg/m    Physical Exam Vitals reviewed.  Constitutional:      General: She is not in acute distress.    Appearance: Normal appearance. She is normal weight. She is not ill-appearing, toxic-appearing or diaphoretic.  Eyes:     General:        Right eye: No discharge.        Left eye: No discharge.     Conjunctiva/sclera: Conjunctivae normal.  Cardiovascular:     Rate and Rhythm: Normal rate.  Pulmonary:     Effort: Pulmonary effort is normal.  Musculoskeletal:        General: Normal range of  motion.  Skin:    General: Skin is warm and dry.  Neurological:     Mental Status: She is alert and oriented to person, place, and time. Mental status is at baseline.  Psychiatric:        Mood and Affect: Mood normal.        Behavior: Behavior normal.        Thought Content: Thought content normal.        Judgment: Judgment normal.           11/02/2023    1:16 PM 10/22/2023    8:43 AM 04/21/2023    9:09 AM  Depression screen PHQ 2/9  Decreased Interest 0 0 0  Down, Depressed, Hopeless 0 0 0  PHQ - 2 Score 0 0 0  Altered sleeping 2 2 0  Tired, decreased energy 0 0 0  Change in appetite 0 0 0  Feeling bad or failure about yourself  0 0 0  Trouble concentrating 0 0 0  Moving slowly or fidgety/restless 0 0 0  Suicidal thoughts 0 0 0  PHQ-9 Score 2 2 0  Difficult doing work/chores Not difficult at all Not difficult at all Not difficult at all      11/02/2023    1:16 PM 10/22/2023    8:43 AM 04/21/2023  9:09 AM  GAD 7 : Generalized Anxiety Score  Nervous, Anxious, on Edge 1 0 0  Control/stop worrying 0 0 0  Worry too much - different things 0 0 0  Trouble relaxing 0 0 0  Restless 0 0 0  Easily annoyed or irritable 0 0 0  Afraid - awful might happen 0 0 0  Total GAD 7 Score 1 0 0  Anxiety Difficulty Not difficult at all Not difficult at all Not difficult at all    ASSESSMENT/PLAN:  Renal calculi Assessment & Plan: 6mm non-obstructing stone in the left kidney. Patient has had intermittent pain. - Start Flomax for 4 weeks to facilitate stone passage. - Advise patient to strain urine and monitor for stone passage. -Increase water intake daily - If pain persists after 4 weeks or if stone does not pass, consider referral to urology.   Chronic interstitial cystitis Assessment & Plan: Chronic condition with calcification noted on imaging. - Continue current management.   Pulmonary nodule, right Assessment & Plan: 2 noncalcified nodules in the right lung measuring  3mm or smaller noted on recent CT renal study.  Incidental finding on imaging. Low risk due to no history of smoking or respiratory disease. No further follow up recommended. -Discussed if becomes symptomatic can repeat imaging or refer to pulmonology   Menopausal syndrome (hot flashes) Assessment & Plan: Starting Veozah per PCP - Monitor liver function before starting medication, and at 3, 6, 9, and 12 months after starting. - Use savings card for prescription cost. Given to patient      PDMP reviewed  Return if symptoms worsen or fail to improve, for PCP.  Dana Allan, MD

## 2023-11-12 ENCOUNTER — Encounter: Payer: Self-pay | Admitting: Family Medicine

## 2023-11-12 DIAGNOSIS — N200 Calculus of kidney: Secondary | ICD-10-CM | POA: Insufficient documentation

## 2023-11-12 DIAGNOSIS — R911 Solitary pulmonary nodule: Secondary | ICD-10-CM | POA: Insufficient documentation

## 2023-11-12 NOTE — Assessment & Plan Note (Addendum)
 2 noncalcified nodules in the right lung measuring 3mm or smaller noted on recent CT renal study.  Incidental finding on imaging. Low risk due to no history of smoking or respiratory disease. No further follow up recommended. -Discussed if becomes symptomatic can repeat imaging or refer to pulmonology

## 2023-11-12 NOTE — Assessment & Plan Note (Addendum)
 Starting Veozah per PCP - Monitor liver function before starting medication, and at 3, 6, 9, and 12 months after starting. - Use savings card for prescription cost. Given to patient

## 2023-11-12 NOTE — Assessment & Plan Note (Addendum)
 6mm non-obstructing stone in the left kidney. Patient has had intermittent pain. - Start Flomax for 4 weeks to facilitate stone passage. - Advise patient to strain urine and monitor for stone passage. -Increase water intake daily - If pain persists after 4 weeks or if stone does not pass, consider referral to urology.

## 2023-11-12 NOTE — Assessment & Plan Note (Signed)
 Chronic condition with calcification noted on imaging. - Continue current management.

## 2023-11-19 ENCOUNTER — Other Ambulatory Visit: Payer: Self-pay | Admitting: Nurse Practitioner

## 2023-11-19 DIAGNOSIS — F419 Anxiety disorder, unspecified: Secondary | ICD-10-CM

## 2023-11-19 DIAGNOSIS — F32A Depression, unspecified: Secondary | ICD-10-CM

## 2023-12-01 ENCOUNTER — Ambulatory Visit
Admission: RE | Admit: 2023-12-01 | Discharge: 2023-12-01 | Disposition: A | Discharge status: Home / Self Care | Payer: BC Managed Care – PPO | Source: Ambulatory Visit | Attending: Nurse Practitioner | Admitting: Nurse Practitioner

## 2023-12-01 DIAGNOSIS — Z1231 Encounter for screening mammogram for malignant neoplasm of breast: Secondary | ICD-10-CM | POA: Diagnosis not present

## 2023-12-04 ENCOUNTER — Encounter: Payer: Self-pay | Admitting: Nurse Practitioner

## 2024-02-05 ENCOUNTER — Ambulatory Visit
Admission: EM | Admit: 2024-02-05 | Discharge: 2024-02-05 | Disposition: A | Discharge status: Home / Self Care | Attending: Emergency Medicine | Admitting: Emergency Medicine

## 2024-02-05 DIAGNOSIS — R300 Dysuria: Secondary | ICD-10-CM | POA: Diagnosis not present

## 2024-02-05 HISTORY — DX: Interstitial cystitis (chronic) without hematuria: N30.10

## 2024-02-05 LAB — POCT URINALYSIS DIP (MANUAL ENTRY)
Bilirubin, UA: NEGATIVE
Glucose, UA: NEGATIVE mg/dL
Ketones, POC UA: NEGATIVE mg/dL
Nitrite, UA: NEGATIVE
Protein Ur, POC: 100 mg/dL — AB
Spec Grav, UA: 1.015 (ref 1.010–1.025)
Urobilinogen, UA: 0.2 U/dL
pH, UA: 6.5 (ref 5.0–8.0)

## 2024-02-05 MED ORDER — CEPHALEXIN 500 MG PO CAPS: 500.0000 mg | ORAL_CAPSULE | Freq: Three times a day (TID) | ORAL | 0 refills | Status: AC

## 2024-02-05 NOTE — Discharge Instructions (Addendum)
 Take the antibiotic as directed.  The urine culture is pending.  We will call you if it shows the need to change or discontinue your antibiotic.    Follow up with your primary care provider if your symptoms are not improving.

## 2024-02-05 NOTE — ED Provider Notes (Signed)
 Angela Park    CSN: [id number] Arrival date & time: 02/05/24  1223      History   Chief Complaint Chief Complaint  Patient presents with   Urinary Frequency    HPI Angela Park is a 63 y.o. female.  Patient presents with 1 day history of dysuria and urinary frequency.  No fever, chills, abdominal pain, hematuria, vaginal discharge, pelvic pain.  No OTC medication taken today.  Her medical history includes chronic interstitial cystitis.  The history is provided by the patient and medical records.    Past Medical History:  Diagnosis Date   Atypical mole 11/22/2014   right mid back    Depression    Dysplastic nevus 2018   R ant hip, mod to severe   GERD (gastroesophageal reflux disease)    Interstitial cystitis     Patient Active Problem List   Diagnosis Date Noted   Renal calculi 11/12/2023   Pulmonary nodule, right 11/12/2023   Flank pain 11/02/2023   Prediabetes 04/22/2023   Acquired hypothyroidism 05/30/2016   Anxiety and depression 05/30/2016   Menopausal syndrome (hot flashes) 11/14/2014   GERD 12/18/2010   EUSTACHIAN TUBE DYSFUNCTION, RIGHT 12/25/2008   Otitis media 12/25/2008   HYPERCHOLESTEROLEMIA 10/10/2008   Chronic interstitial cystitis 09/12/2008   FLANK PAIN, LEFT 09/11/2008    Past Surgical History:  Procedure Laterality Date   PARTIAL HYSTERECTOMY  2005   TONSILLECTOMY  1967    OB History     Gravida  4   Para  3   Term  3   Preterm      AB  1   Living  3      SAB  1   IAB      Ectopic      Multiple      Live Births               Home Medications    Prior to Admission medications   Medication Sig Start Date End Date Taking? Authorizing Provider  cephALEXin (KEFLEX) 500 MG capsule Take 1 capsule (500 mg total) by mouth 3 (three) times daily for 5 days. 02/05/24 02/10/24 Yes Wellington Half, NP  ALPRAZolam  (XANAX ) 0.5 MG tablet Take 1 tablet (0.5 mg total) by mouth daily as needed for anxiety. 10/28/23    Lester, Kacy, NP  Cholecalciferol (VITAMIN D -1000 MAX ST) 25 MCG (1000 UT) tablet Take by mouth.    [provider]  fexofenadine (ALLEGRA) 180 MG tablet Take by mouth.    [provider]  Fezolinetant  (VEOZAH ) 45 MG TABS Take 1 tablet (45 mg total) by mouth daily. Patient not taking: Reported on 02/05/2024 04/21/23   Bluford Burkitt, NP  levothyroxine  (SYNTHROID ) 25 MCG tablet Take 1 tablet (25 mcg total) by mouth daily before breakfast. 04/22/23   Bluford Burkitt, NP  Multiple Vitamin (MULTI-VITAMIN) tablet Take 1 tablet by mouth daily.    [provider]  omeprazole  (PRILOSEC) 40 MG capsule Take 1 capsule (40 mg total) by mouth daily. 04/21/23   Bluford Burkitt, NP  PARoxetine  (PAXIL ) 10 MG tablet Take 1 tablet (10 mg total) by mouth daily. 10/28/23   Bluford Burkitt, NP  tamsulosin  (FLOMAX ) 0.4 MG CAPS capsule Take 1 capsule (0.4 mg total) by mouth daily. Patient not taking: Reported on 02/05/2024 11/02/23   Valli Gaw, MD    Family History Family History  Problem Relation Age of Onset   Miscarriages / [place] Mother    Hearing loss Mother  Diabetes Mother    Arthritis Mother    Depression Sister    Diabetes Sister    Depression Sister    Breast cancer Neg Hx     Social History Social History   Tobacco Use   Smoking status: Never   Smokeless tobacco: Never  Vaping Use   Vaping status: Never Used  Substance Use Topics   Alcohol use: Never   Drug use: Never     Allergies   Sulfonamide derivatives   Review of Systems Review of Systems  Constitutional:  Negative for chills and fever.  Gastrointestinal:  Negative for abdominal pain.  Genitourinary:  Positive for dysuria and frequency. Negative for flank pain, hematuria, pelvic pain and vaginal discharge.     Physical Exam Triage Vital Signs ED Triage Vitals  Encounter Vitals Group     BP      Systolic BP Percentile      Diastolic BP Percentile      Pulse      Resp      Temp      Temp src       SpO2      Weight      Height      Head Circumference      Peak Flow      Pain Score      Pain Loc      Pain Education      Exclude from Growth Chart    No data found.  Updated Vital Signs BP 134/76   Pulse 81   Temp 97.7 F (36.5 C)   Resp 18   SpO2 96%   Visual Acuity Right Eye Distance:   Left Eye Distance:   Bilateral Distance:    Right Eye Near:   Left Eye Near:    Bilateral Near:     Physical Exam Constitutional:      General: She is not in acute distress. HENT:     Mouth/Throat:     Mouth: Mucous membranes are moist.  Cardiovascular:     Rate and Rhythm: Normal rate and regular rhythm.  Pulmonary:     Effort: Pulmonary effort is normal. No respiratory distress.  Abdominal:     General: Bowel sounds are normal.     Palpations: Abdomen is soft.     Tenderness: There is no abdominal tenderness. There is no right CVA tenderness, left CVA tenderness, guarding or rebound.  Neurological:     Mental Status: She is alert.      UC Treatments / Results  Labs (all labs ordered are listed, but only abnormal results are displayed) Labs Reviewed  POCT URINALYSIS DIP (MANUAL ENTRY) - Abnormal; Notable for the following components:      Result Value   Clarity, UA turbid (*)    Blood, UA moderate (*)    Protein Ur, POC =100 (*)    Leukocytes, UA Small (1+) (*)    All other components within normal limits  URINE CULTURE    EKG   Radiology No results found.  Procedures Procedures (including critical care time)  Medications Ordered in UC Medications - No data to display  Initial Impression / Assessment and Plan / UC Course  I have reviewed the triage vital signs and the nursing notes.  Pertinent labs & imaging results that were available during my care of the patient were reviewed by me and considered in my medical decision making (see chart for details).   Dysuria.  Treating with Keflex. Urine culture  pending. Discussed with patient that we will  call her if the urine culture shows the need to change or discontinue the antibiotic. Instructed her to follow-up with her PCP if her symptoms are not improving. Patient agrees to plan of care.    Final Clinical Impressions(s) / UC Diagnoses   Final diagnoses:  Dysuria     Discharge Instructions      Take the antibiotic as directed.  The urine culture is pending.  We will call you if it shows the need to change or discontinue your antibiotic.    Follow-up with your primary care provider if your symptoms are not improving.    ED Prescriptions     Medication Sig Dispense Auth. Provider   cephALEXin (KEFLEX) 500 MG capsule Take 1 capsule (500 mg total) by mouth 3 (three) times daily for 5 days. 15 capsule Wellington Half, NP      PDMP not reviewed this encounter.   Wellington Half, NP 02/05/24 1323

## 2024-02-05 NOTE — ED Triage Notes (Signed)
 Patient to Urgent Care with complaints of dysuria/ urinary frequency. Denies any fevers.   Symptoms started last night. Hx of interstitial cystitis.

## 2024-02-07 LAB — URINE CULTURE: Culture: 90000 — AB

## 2024-02-08 ENCOUNTER — Ambulatory Visit (HOSPITAL_COMMUNITY): Payer: Self-pay

## 2024-02-24 ENCOUNTER — Encounter: Payer: Self-pay | Admitting: Nurse Practitioner

## 2024-02-24 ENCOUNTER — Ambulatory Visit: Payer: Self-pay

## 2024-02-24 NOTE — Telephone Encounter (Signed)
 FYI Only or Action Required?: FYI only for provider  Patient was last seen in primary care on 11/05/2023 by Valli Gaw, MD. Called Nurse Triage reporting Dysuria. Symptoms began a week ago. Interventions attempted: Other: increased fluids. Symptoms are: gradually worsening.  Triage Disposition: See Physician Within 24 Hours: pt sent to urgent care due to no appts available: pt verbalized understanding.  Patient/caregiver understands and will follow disposition?:  Copied from CRM [id number]. Topic: Clinical - Red Word Triage >> Feb 24, 2024  4:44 PM Angela Park wrote: Red Word that prompted transfer to Nurse Triage: UTI, burning with urination. Reason for Disposition  > 2 UTI's in last year  Answer Assessment - Initial Assessment Questions 1. SEVERITY: "How bad is the pain?"  (e.g., Scale 1-10; mild, moderate, or severe)   - MILD (1-3): complains slightly about urination hurting   - MODERATE (4-7): interferes with normal activities     - SEVERE (8-10): excruciating, unwilling or unable to urinate because of the pain      mild 2. FREQUENCY: "How many times have you had painful urination today?"      every 3. PATTERN: "Is pain present every time you urinate or just sometimes?"      constant 4. ONSET: "When did the painful urination start?"      X last few days 5. FEVER: "Do you have a fever?" If Yes, ask: "What is your temperature, how was it measured, and when did it start?"     no 6. PAST UTI: "Have you had a urine infection before?" If Yes, ask: "When was the last time?" and "What happened that time?"      Yes- was given Abx 7. CAUSE: "What do you think is causing the painful urination?"  (e.g., UTI, scratch, Herpes sore)     UTI 8. OTHER SYMPTOMS: "Do you have any other symptoms?" (e.g., blood in urine, flank pain, genital sores, urgency, vaginal discharge)     Abd - mild cramping  9. PREGNANCY: "Is there any chance you are pregnant?" "When was your last menstrual period?"      N/a  Previous  UTI recently  & completed Abx: pt feels like still has UTI. - pt would like to see if she can drop off a urine sample if possible - if not pt will have to go to urgent care  Protocols used: Urination Pain - [facility]

## 2024-02-24 NOTE — Telephone Encounter (Signed)
 Spoke with pt and she verified that she will be going to [facility] tomorrow.

## 2024-02-25 ENCOUNTER — Ambulatory Visit
Admission: EM | Admit: 2024-02-25 | Discharge: 2024-02-25 | Disposition: A | Discharge status: Home / Self Care | Attending: Emergency Medicine | Admitting: Emergency Medicine

## 2024-02-25 DIAGNOSIS — R300 Dysuria: Secondary | ICD-10-CM | POA: Diagnosis not present

## 2024-02-25 LAB — POCT URINALYSIS DIP (MANUAL ENTRY)
Bilirubin, UA: NEGATIVE
Glucose, UA: NEGATIVE mg/dL
Ketones, POC UA: NEGATIVE mg/dL
Nitrite, UA: NEGATIVE
Protein Ur, POC: NEGATIVE mg/dL
Spec Grav, UA: 1.02 (ref 1.010–1.025)
Urobilinogen, UA: 0.2 U/dL
pH, UA: 6 (ref 5.0–8.0)

## 2024-02-25 NOTE — Discharge Instructions (Addendum)
 Urine culture is pending.  We will call you if it shows the need for treatment.  Follow up with your primary care provider tomorrow.  Go to the emergency department if you have worsening symptoms.

## 2024-02-25 NOTE — ED Provider Notes (Signed)
 Arlander Bellman    CSN: [id number] Arrival date & time: 02/25/24  7829      History   Chief Complaint Chief Complaint  Patient presents with   Urinary Frequency    HPI LEONELA KIVI is a 63 y.o. female.  Patient presents with ongoing dysuria x 2-3 weeks.  No fever, hematuria, flank pain.  She was seen here on 02/05/2024; urine culture positive for 90,000 colonies of E. coli and was sensitive to the cephalexin  which was prescribed.  Patient reports her symptoms improved but she has still had mild dysuria, urinary frequency, lower abdominal pain.  She would like her urine rechecked.  Her medical history includes chronic interstitial cystitis.    The history is provided by the patient and medical records.    Past Medical History:  Diagnosis Date   Atypical mole 11/22/2014   right mid back    Depression    Dysplastic nevus 2018   R ant hip, mod to severe   GERD (gastroesophageal reflux disease)    Interstitial cystitis     Patient Active Problem List   Diagnosis Date Noted   Renal calculi 11/12/2023   Pulmonary nodule, right 11/12/2023   Flank pain 11/02/2023   Prediabetes 04/22/2023   Acquired hypothyroidism 05/30/2016   Anxiety and depression 05/30/2016   Menopausal syndrome (hot flashes) 11/14/2014   GERD 12/18/2010   EUSTACHIAN TUBE DYSFUNCTION, RIGHT 12/25/2008   Otitis media 12/25/2008   HYPERCHOLESTEROLEMIA 10/10/2008   Chronic interstitial cystitis 09/12/2008   FLANK PAIN, LEFT 09/11/2008    Past Surgical History:  Procedure Laterality Date   PARTIAL HYSTERECTOMY  2005   TONSILLECTOMY  1967    OB History     Gravida  4   Para  3   Term  3   Preterm      AB  1   Living  3      SAB  1   IAB      Ectopic      Multiple      Live Births               Home Medications    Prior to Admission medications   Medication Sig Start Date End Date Taking? Authorizing Provider  ALPRAZolam  (XANAX ) 0.5 MG tablet Take 1 tablet (0.5 mg  total) by mouth daily as needed for anxiety. 10/28/23  Yes Bluford Burkitt, NP  Cholecalciferol (VITAMIN D -1000 MAX ST) 25 MCG (1000 UT) tablet Take by mouth.   Yes [provider]  fexofenadine (ALLEGRA) 180 MG tablet Take by mouth.   Yes [provider]  levothyroxine  (SYNTHROID ) 25 MCG tablet Take 1 tablet (25 mcg total) by mouth daily before breakfast. 04/22/23  Yes Bluford Burkitt, NP  Multiple Vitamin (MULTI-VITAMIN) tablet Take 1 tablet by mouth daily.   Yes [provider]  omeprazole  (PRILOSEC) 40 MG capsule Take 1 capsule (40 mg total) by mouth daily. 04/21/23  Yes Bluford Burkitt, NP  PARoxetine  (PAXIL ) 10 MG tablet Take 1 tablet (10 mg total) by mouth daily. 10/28/23  Yes Bluford Burkitt, NP  Fezolinetant  (VEOZAH ) 45 MG TABS Take 1 tablet (45 mg total) by mouth daily. Patient not taking: Reported on 02/05/2024 04/21/23   Bluford Burkitt, NP  tamsulosin  (FLOMAX ) 0.4 MG CAPS capsule Take 1 capsule (0.4 mg total) by mouth daily. Patient not taking: Reported on 02/05/2024 11/02/23   Valli Gaw, MD    Family History Family History  Problem Relation Age of Onset  Miscarriages / [place] Mother    Hearing loss Mother    Diabetes Mother    Arthritis Mother    Depression Sister    Diabetes Sister    Depression Sister    Breast cancer Neg Hx     Social History Social History   Tobacco Use   Smoking status: Never   Smokeless tobacco: Never  Vaping Use   Vaping status: Never Used  Substance Use Topics   Alcohol use: Never   Drug use: Never     Allergies   Sulfonamide derivatives   Review of Systems Review of Systems  Constitutional:  Negative for chills and fever.  Gastrointestinal:  Positive for abdominal pain. Negative for vomiting.  Genitourinary:  Positive for dysuria and frequency. Negative for flank pain and hematuria.     Physical Exam Triage Vital Signs ED Triage Vitals  Encounter Vitals Group     BP      Systolic BP Percentile      Diastolic  BP Percentile      Pulse      Resp      Temp      Temp src      SpO2      Weight      Height      Head Circumference      Peak Flow      Pain Score      Pain Loc      Pain Education      Exclude from Growth Chart    No data found.  Updated Vital Signs BP (!) 140/88 (BP Location: Left Arm)   Pulse 71   Temp 98.2 F (36.8 C) (Oral)   Resp 18   SpO2 98%   Visual Acuity Right Eye Distance:   Left Eye Distance:   Bilateral Distance:    Right Eye Near:   Left Eye Near:    Bilateral Near:     Physical Exam Constitutional:      General: She is not in acute distress. HENT:     Mouth/Throat:     Mouth: Mucous membranes are moist.  Cardiovascular:     Rate and Rhythm: Normal rate and regular rhythm.  Pulmonary:     Effort: Pulmonary effort is normal. No respiratory distress.  Abdominal:     General: Bowel sounds are normal.     Palpations: Abdomen is soft.     Tenderness: There is no abdominal tenderness. There is no right CVA tenderness, left CVA tenderness, guarding or rebound.  Neurological:     Mental Status: She is alert.      UC Treatments / Results  Labs (all labs ordered are listed, but only abnormal results are displayed) Labs Reviewed  POCT URINALYSIS DIP (MANUAL ENTRY) - Abnormal; Notable for the following components:      Result Value   Blood, UA trace-intact (*)    Leukocytes, UA Small (1+) (*)    All other components within normal limits  URINE CULTURE    EKG   Radiology No results found.  Procedures Procedures (including critical care time)  Medications Ordered in UC Medications - No data to display  Initial Impression / Assessment and Plan / UC Course  I have reviewed the triage vital signs and the nursing notes.  Pertinent labs & imaging results that were available during my care of the patient were reviewed by me and considered in my medical decision making (see chart for details).    Dysuria.  Patient  just completed a course  of cephalexin ; Urine culture at that time showed that the E. coli was sensitive to the antibiotic.  Today urine has small leukocytes and trace blood.  Urine culture pending.  Discussed with patient that we will call her if the culture shows the need for treatment.  Instructed patient to follow up with her PCP.  ED precautions given.  She agrees to plan of care.   Final Clinical Impressions(s) / UC Diagnoses   Final diagnoses:  Dysuria     Discharge Instructions      Urine culture is pending.  We will call you if it shows the need for treatment.  Follow up with your primary care provider tomorrow.  Go to the emergency department if you have worsening symptoms.      ED Prescriptions   None    PDMP not reviewed this encounter.   Wellington Half, NP 02/25/24 1041

## 2024-02-25 NOTE — ED Triage Notes (Addendum)
 Pt c/o urinary frequency, burning with urination, lower abdominal pain x 3 weeks.  Pt was seen on / here for UTI and treated, she took prescribed medication stated her symptoms did not go away and tried to wait it out.  Taking tylenol and ibuprofen .

## 2024-02-27 LAB — URINE CULTURE: Culture: 60000 — AB

## 2024-02-29 ENCOUNTER — Ambulatory Visit (HOSPITAL_COMMUNITY): Payer: Self-pay

## 2024-02-29 MED ORDER — CEPHALEXIN 500 MG PO CAPS: 500.0000 mg | ORAL_CAPSULE | Freq: Three times a day (TID) | ORAL | 0 refills | Status: AC

## 2024-03-01 ENCOUNTER — Telehealth: Payer: Self-pay

## 2024-03-01 NOTE — Telephone Encounter (Signed)
 Confirmed patient's medication is ready at [facility]. Per pharmacy staff prescription has been filled. Patient notified.

## 2024-04-13 ENCOUNTER — Ambulatory Visit: Payer: 59 | Admitting: Nurse Practitioner

## 2024-04-20 ENCOUNTER — Encounter: Payer: Self-pay | Admitting: Nurse Practitioner

## 2024-04-20 ENCOUNTER — Ambulatory Visit: Payer: Self-pay | Admitting: Nurse Practitioner

## 2024-04-20 ENCOUNTER — Ambulatory Visit: Admitting: Nurse Practitioner

## 2024-04-20 VITALS — BP 120/84 | HR 79 | Temp 98.3°F | Ht 64.0 in | Wt 157.8 lb

## 2024-04-20 DIAGNOSIS — N951 Menopausal and female climacteric states: Secondary | ICD-10-CM | POA: Diagnosis not present

## 2024-04-20 DIAGNOSIS — R7303 Prediabetes: Secondary | ICD-10-CM

## 2024-04-20 DIAGNOSIS — E039 Hypothyroidism, unspecified: Secondary | ICD-10-CM | POA: Diagnosis not present

## 2024-04-20 DIAGNOSIS — F419 Anxiety disorder, unspecified: Secondary | ICD-10-CM | POA: Diagnosis not present

## 2024-04-20 DIAGNOSIS — F32A Depression, unspecified: Secondary | ICD-10-CM

## 2024-04-20 LAB — COMPREHENSIVE METABOLIC PANEL WITH GFR
ALT: 17 U/L (ref 0–35)
AST: 18 U/L (ref 0–37)
Albumin: 4.2 g/dL (ref 3.5–5.2)
Alkaline Phosphatase: 66 U/L (ref 39–117)
BUN: 15 mg/dL (ref 6–23)
CO2: 28 meq/L (ref 19–32)
Calcium: 9.1 mg/dL (ref 8.4–10.5)
Chloride: 102 meq/L (ref 96–112)
Creatinine, Ser: 0.8 mg/dL (ref 0.40–1.20)
GFR: 77.59 mL/min (ref >60.00)
Glucose, Bld: 111 mg/dL — ABNORMAL HIGH (ref 70–99)
Potassium: 3.8 meq/L (ref 3.5–5.1)
Sodium: 137 meq/L (ref 135–145)
Total Bilirubin: 0.4 mg/dL (ref 0.2–1.2)
Total Protein: 7.4 g/dL (ref 6.0–8.3)

## 2024-04-20 LAB — TSH: TSH: 3.94 u[IU]/mL (ref 0.35–5.50)

## 2024-04-20 LAB — HEMOGLOBIN A1C: Hgb A1c MFr Bld: 5.9 % (ref 4.6–6.5)

## 2024-04-20 MED ORDER — ESTRADIOL 1 MG PO TABS: 1.0000 mg | ORAL_TABLET | Freq: Every day | ORAL | 3 refills | Status: DC

## 2024-04-20 MED ORDER — ESCITALOPRAM OXALATE 5 MG PO TABS: 5.0000 mg | ORAL_TABLET | Freq: Every day | ORAL | Status: DC

## 2024-04-20 MED ORDER — ALPRAZOLAM 0.5 MG PO TABS: 0.5000 mg | ORAL_TABLET | Freq: Every day | ORAL | 1 refills | Status: AC | PRN

## 2024-04-20 NOTE — Progress Notes (Signed)
 Leron Glance, NP-C Phone: [phone]  Angela Park is a 63 y.o. female who presents today for follow up.   Discussed the use of AI scribe software for clinical note transcription with the patient, who gave verbal consent to proceed.  History of Present Illness   AVERYANNA Park is a 63 year old female who presents for a six-month follow-up regarding hormone replacement therapy and medication management.  She experiences persistent hot flashes and resumed taking estradiol . Initially, she took 0.5 mg, which was ineffective, so she increased the dose to 1 mg by taking two 0.5 mg tablets. This adjustment has significantly alleviated her symptoms, reducing the severity of hot flashes to the point where her neck is no longer wet from sweating.  She switched from Paxil  10 mg to Lexapro  5 mg and is doing well on the new medication. Her mood is stable on the current dose, and she has a substantial supply of Lexapro , not requiring a new prescription at this time.  She uses Xanax  sparingly, approximately three times a month, primarily for anxiety related to interstitial cystitis flare-ups. She experienced a bladder infection while traveling in May, which required antibiotics and increased her Xanax  use temporarily. She is running low on Xanax  due to this increased usage.  She continues to take levothyroxine  for thyroid  management and reports no symptoms such as heart palpitations, skin, nail, or hair problems.  She mentions a recent weight fluctuation, having lost weight but regained some during the summer. She is actively engaging in physical activities, including water aerobics and Pilates, twice a week. Her family history includes a mother who lived to 93 years old and took estrogen for a significant period. She does not smoke or drink alcohol.      Social History   Tobacco Use  Smoking Status Never  Smokeless Tobacco Never    Current Outpatient Medications on File Prior to Visit  Medication  Sig Dispense Refill   Cholecalciferol (VITAMIN D -1000 MAX ST) 25 MCG (1000 UT) tablet Take by mouth.     fexofenadine (ALLEGRA) 180 MG tablet Take by mouth.     levothyroxine  (SYNTHROID ) 25 MCG tablet Take 1 tablet (25 mcg total) by mouth daily before breakfast. 90 tablet 3   Multiple Vitamin (MULTI-VITAMIN) tablet Take 1 tablet by mouth daily.     omeprazole  (PRILOSEC) 40 MG capsule Take 1 capsule (40 mg total) by mouth daily. 90 capsule 3   No current facility-administered medications on file prior to visit.     ROS see history of present illness  Objective  Physical Exam Vitals:   04/20/24 1303  BP: 120/84  Pulse: 79  Temp: 98.3 F (36.8 C)  SpO2: 95%    BP Readings from Last 3 Encounters:  04/20/24 120/84  02/25/24 (!) 140/88  02/05/24 134/76   Wt Readings from Last 3 Encounters:  04/20/24 157 lb 12.8 oz (71.6 kg)  11/05/23 162 lb (73.5 kg)  11/02/23 162 lb 8 oz (73.7 kg)    Physical Exam Constitutional:      General: She is not in acute distress.    Appearance: Normal appearance.  HENT:     Head: Normocephalic.  Cardiovascular:     Rate and Rhythm: Normal rate and regular rhythm.     Heart sounds: Normal heart sounds.  Pulmonary:     Effort: Pulmonary effort is normal.     Breath sounds: Normal breath sounds.  Skin:    General: Skin is warm and dry.  Neurological:  General: No focal deficit present.     Mental Status: She is alert.  Psychiatric:        Mood and Affect: Mood normal.        Behavior: Behavior normal.      Assessment/Plan: Please see individual problem list.  Menopausal syndrome (hot flashes) Assessment & Plan: Persistent hot flashes have improved with estradiol  1 mg. She is aware of the risks and benefits of hormone therapy and is comfortable with the current regimen. Send a new prescription for estradiol  1 mg. Finish the current supply of estradiol  0.5 mg by taking two tablets to achieve a 1 mg dose. BP well controlled. No  history of DVT/PE. We will continue to monitor.   Orders: -     Comprehensive metabolic panel with GFR -     Estradiol ; Take 1 tablet (1 mg total) by mouth daily.  Dispense: 90 tablet; Refill: 3  Anxiety and depression Assessment & Plan: Her mood is well-managed on Lexapro  5 mg daily. Continue. Xanax  is used sparingly for anxiety during interstitial cystitis flare-ups. Refill Xanax  prescription. PDMP reviewed. Encouraged to contact if worsening symptoms, unusual behavior changes or suicidal thoughts occur.   Orders: -     ALPRAZolam ; Take 1 tablet (0.5 mg total) by mouth daily as needed for anxiety.  Dispense: 30 tablet; Refill: 1 -     Escitalopram  Oxalate; Take 1 tablet (5 mg total) by mouth daily.  Acquired hypothyroidism Assessment & Plan: Her condition is stable on levothyroxine  25 mcg daily with no symptoms. Continue. Check TSH.  Orders: -     TSH  Prediabetes Assessment & Plan: Check A1c. Encourage healthy diet and regular exercise.   Orders: -     Hemoglobin A1c     Return in about 6 months (around 10/21/2024) for Follow up.   Leron Glance, NP-C [facility]

## 2024-04-27 ENCOUNTER — Encounter: Payer: Self-pay | Admitting: Nurse Practitioner

## 2024-04-27 NOTE — Assessment & Plan Note (Signed)
 Her condition is stable on levothyroxine  25 mcg daily with no symptoms. Continue. Check TSH.

## 2024-04-27 NOTE — Assessment & Plan Note (Signed)
 Persistent hot flashes have improved with estradiol  1 mg. She is aware of the risks and benefits of hormone therapy and is comfortable with the current regimen. Send a new prescription for estradiol  1 mg. Finish the current supply of estradiol  0.5 mg by taking two tablets to achieve a 1 mg dose. BP well controlled. No history of DVT/PE. We will continue to monitor.

## 2024-04-27 NOTE — Assessment & Plan Note (Signed)
 Check A1c. Encourage healthy diet and regular exercise.

## 2024-04-27 NOTE — Assessment & Plan Note (Signed)
 Her mood is well-managed on Lexapro  5 mg daily. Continue. Xanax  is used sparingly for anxiety during interstitial cystitis flare-ups. Refill Xanax  prescription. PDMP reviewed. Encouraged to contact if worsening symptoms, unusual behavior changes or suicidal thoughts occur.

## 2024-05-04 ENCOUNTER — Ambulatory Visit: Admitting: Dermatology

## 2024-05-04 DIAGNOSIS — L980 Pyogenic granuloma: Secondary | ICD-10-CM | POA: Diagnosis not present

## 2024-05-04 DIAGNOSIS — L989 Disorder of the skin and subcutaneous tissue, unspecified: Secondary | ICD-10-CM

## 2024-05-04 DIAGNOSIS — M6740 Ganglion, unspecified site: Secondary | ICD-10-CM | POA: Diagnosis not present

## 2024-05-04 DIAGNOSIS — D492 Neoplasm of unspecified behavior of bone, soft tissue, and skin: Secondary | ICD-10-CM | POA: Diagnosis not present

## 2024-05-04 MED ORDER — MUPIROCIN 2 % EX OINT: 1.0000 | TOPICAL_OINTMENT | Freq: Every day | CUTANEOUS | 0 refills | Status: AC

## 2024-05-04 NOTE — Patient Instructions (Addendum)

## 2024-05-04 NOTE — Progress Notes (Signed)
   Follow-Up Visit   Subjective  Angela Park is a 63 y.o. female who presents for the following: check painful knot on L middle finger, 78m, irritating, tender prn   The following portions of the chart were reviewed this encounter and updated as appropriate: medications, allergies, medical history  Review of Systems:  No other skin or systemic complaints except as noted in HPI or Assessment and Plan.  Objective  Well appearing patient in no apparent distress; mood and affect are within normal limits.   A focused examination was performed of the following areas: L hand/finger  Relevant exam findings are noted in the Assessment and Plan.  L 3rd medial fingertip DIP 7.71mm firm pink blue papule   Assessment & Plan  NEOPLASM OF SKIN L 3rd medial fingertip DIP Epidermal / dermal shaving  Lesion diameter (cm):  0.7 Informed consent: discussed and consent obtained   Patient was prepped and draped in usual sterile fashion: area prepped with alcohol. Anesthesia: the lesion was anesthetized in a standard fashion   Anesthetic:  1% lidocaine w/ epinephrine 1-100,000 buffered w/ 8.4% NaHCO3 Instrument used: flexible razor blade   Hemostasis achieved with: pressure, aluminum chloride and electrodesiccation   Outcome: patient tolerated procedure well   Post-procedure details: wound care instructions given   Post-procedure details comment:  Ointment and small bandage applied  mupirocin  ointment (BACTROBAN ) 2 % Apply 1 Application topically daily. 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 Symptomatic, painful, patient would like treated.   Chronic and persistent condition with duration or expected duration over one year. Condition is bothersome/symptomatic for patient. Currently flared.    A digital mucous cyst also  known as a myxoid cyst or pseudocyst is a ganglion cyst arising from the distal interphalangeal (DIP) joint of the finger or thumb (or, less commonly, toe). The cysts are believed to form from degeneration of connective tissue and are associated with osteoarthritic joints or injury. Although the exact etiology is unknown, it is likely that a small tear forms in a joint capsule or tendon sheath, allowing extravasation of synovial fluid into the adjacent tissue. When the fluid reacts with local tissue, it becomes more gelatinous and a cyst wall forms. With any treatment, there is a high rate of recurrence.   Treatment options include: - Puncture / Incision & Drainage (I&D) - Intralesional steroid injection - Intralesional Sclerosant injection (Asclera/ Polidocanol) - Intralesional steroid + sclerosant - Corticosteroid tape - Cryosurgery - Laser (CO2) - Infrared photocoagulation - Excision / Surgery   Discussed shave removal (discussed resulting scar and may recur) vs IL steroid injection vs referral to hand surgeon for excision.  Pt prefers shave removal today.   Keep area clean, cleanse with soap and water, monitor for infection (redness, swelling, drainage, pain) Start Mupirocin  oint qd to biopsy site and cover  Return for as scheduled for TBSE, Hx of Dysplastic nevi.  I, Grayce Saunas, RMA, am acting as scribe for Rexene Rattler, MD .   Documentation: I have reviewed the above documentation for accuracy and completeness, and I agree with the above.  Rexene Rattler, MD

## 2024-05-06 LAB — SURGICAL PATHOLOGY

## 2024-05-09 ENCOUNTER — Ambulatory Visit: Payer: Self-pay | Admitting: Dermatology

## 2024-05-09 NOTE — Telephone Encounter (Signed)
-----   Message from Rexene Rattler sent at 05/09/2024 10:49 AM EDT ----- 1. Skin, L 3rd medial fingertip DIP :       PYOGENIC GRANULOMA, DEEP MARGIN INVOLVED   Benign, deep PG (vascular growth that usually comes up after trauma).  Generally these present more on the surface of the skin as a friable bleeding bump so this is more unusual presentation.  Not a  digital mucous cyst as clinically suspected.  Because it is so deep, the PG could recur.  If it recurs, we would need to repeat the procedure to try and destroy it. - please call patient ----- Message ----- From: Interface, [facility] Sent: 05/06/2024   5:05 PM EDT To: Rexene Rattler, MD

## 2024-05-09 NOTE — Telephone Encounter (Signed)
Advised patient of biopsy results.

## 2024-05-13 ENCOUNTER — Ambulatory Visit
Admission: EM | Admit: 2024-05-13 | Discharge: 2024-05-13 | Disposition: A | Discharge status: Home / Self Care | Attending: Emergency Medicine | Admitting: Emergency Medicine

## 2024-05-13 DIAGNOSIS — R300 Dysuria: Secondary | ICD-10-CM | POA: Diagnosis not present

## 2024-05-13 LAB — POCT URINE DIPSTICK
Bilirubin, UA: NEGATIVE
Glucose, UA: NEGATIVE mg/dL
Ketones, POC UA: NEGATIVE mg/dL
Leukocytes, UA: NEGATIVE
Nitrite, UA: NEGATIVE
Protein Ur, POC: NEGATIVE mg/dL
Spec Grav, UA: 1.02 (ref 1.010–1.025)
Urobilinogen, UA: 0.2 U/dL
pH, UA: 5.5 (ref 5.0–8.0)

## 2024-05-13 NOTE — ED Triage Notes (Signed)
 Patient states that she's been feeling the need to urinate more often for about 2 weeks

## 2024-05-13 NOTE — Discharge Instructions (Signed)
 Your urine does not indicate infection at this time.  Follow-up with your primary care provider or urologist if your symptoms are not improving.

## 2024-05-13 NOTE — ED Provider Notes (Signed)
 Angela Park    CSN: [id number] Arrival date & time: 05/13/24  1317      History   Chief Complaint Chief Complaint  Patient presents with   Urinary Frequency    HPI Angela Park is a 63 y.o. female.  Patient presents with 2-week history of discomfort with urination.  She reports urinary frequency and urgency.  No fever, abdominal pain, hematuria, flank pain.  No OTC medications today.  Her medical history includes chronic cystitis.  The history is provided by the patient and medical records.    Past Medical History:  Diagnosis Date   Atypical mole 11/22/2014   right mid back    Depression    Dysplastic nevus 2018   R ant hip, mod to severe   GERD (gastroesophageal reflux disease)    Interstitial cystitis     Patient Active Problem List   Diagnosis Date Noted   Renal calculi 11/12/2023   Pulmonary nodule, right 11/12/2023   Flank pain 11/02/2023   Prediabetes 04/22/2023   Acquired hypothyroidism 05/30/2016   Anxiety and depression 05/30/2016   Menopausal syndrome (hot flashes) 11/14/2014   GERD 12/18/2010   EUSTACHIAN TUBE DYSFUNCTION, RIGHT 12/25/2008   Otitis media 12/25/2008   HYPERCHOLESTEROLEMIA 10/10/2008   Chronic interstitial cystitis 09/12/2008   FLANK PAIN, LEFT 09/11/2008    Past Surgical History:  Procedure Laterality Date   PARTIAL HYSTERECTOMY  2005   TONSILLECTOMY  1967    OB History     Gravida  4   Para  3   Term  3   Preterm      AB  1   Living  3      SAB  1   IAB      Ectopic      Multiple      Live Births               Home Medications    Prior to Admission medications   Medication Sig Start Date End Date Taking? Authorizing Provider  escitalopram  (LEXAPRO ) 5 MG tablet Take 1 tablet (5 mg total) by mouth daily. 04/20/24  Yes Gretel App, NP  estradiol  (ESTRACE ) 1 MG tablet Take 1 tablet (1 mg total) by mouth daily. 04/20/24  Yes Gretel App, NP  levothyroxine  (SYNTHROID ) 25 MCG tablet Take 1  tablet (25 mcg total) by mouth daily before breakfast. 04/22/23  Yes Gretel App, NP  ALPRAZolam  (XANAX ) 0.5 MG tablet Take 1 tablet (0.5 mg total) by mouth daily as needed for anxiety. 04/20/24   Lester, Kacy, NP  Cholecalciferol (VITAMIN D -1000 MAX ST) 25 MCG (1000 UT) tablet Take by mouth.    [provider]  fexofenadine (ALLEGRA) 180 MG tablet Take by mouth.    [provider]  Multiple Vitamin (MULTI-VITAMIN) tablet Take 1 tablet by mouth daily.    [provider]  mupirocin  ointment (BACTROBAN ) 2 % Apply 1 Application topically daily. Qd to to biopsy site on left 3rd finger 05/04/24   Jackquline Sawyer, MD  omeprazole  (PRILOSEC) 40 MG capsule Take 1 capsule (40 mg total) by mouth daily. 04/21/23   Gretel App, NP    Family History Family History  Problem Relation Age of Onset   Miscarriages / [place] Mother    Hearing loss Mother    Diabetes Mother    Arthritis Mother    Depression Sister    Diabetes Sister    Depression Sister    Breast cancer Neg Hx  Social History Social History   Tobacco Use   Smoking status: Never   Smokeless tobacco: Never  Vaping Use   Vaping status: Never Used  Substance Use Topics   Alcohol use: Never   Drug use: Never     Allergies   Sulfonamide derivatives   Review of Systems Review of Systems  Constitutional:  Negative for chills and fever.  Gastrointestinal:  Negative for abdominal pain.  Genitourinary:  Positive for dysuria, frequency and urgency. Negative for flank pain and hematuria.     Physical Exam Triage Vital Signs ED Triage Vitals  Encounter Vitals Group     BP      Girls Systolic BP Percentile      Girls Diastolic BP Percentile      Boys Systolic BP Percentile      Boys Diastolic BP Percentile      Pulse      Resp      Temp      Temp src      SpO2      Weight      Height      Head Circumference      Peak Flow      Pain Score      Pain Loc      Pain Education      Exclude  from Growth Chart    No data found.  Updated Vital Signs BP 135/89 (BP Location: Left Arm)   Pulse 79   Temp 98.2 F (36.8 C)   Resp 17   SpO2 98%   Visual Acuity Right Eye Distance:   Left Eye Distance:   Bilateral Distance:    Right Eye Near:   Left Eye Near:    Bilateral Near:     Physical Exam Constitutional:      General: She is not in acute distress. HENT:     Mouth/Throat:     Mouth: Mucous membranes are moist.  Cardiovascular:     Rate and Rhythm: Normal rate and regular rhythm.  Pulmonary:     Effort: Pulmonary effort is normal. No respiratory distress.  Abdominal:     General: Bowel sounds are normal.     Palpations: Abdomen is soft.     Tenderness: There is no abdominal tenderness. There is no right CVA tenderness, left CVA tenderness, guarding or rebound.  Neurological:     Mental Status: She is alert.      UC Treatments / Results  Labs (all labs ordered are listed, but only abnormal results are displayed) Labs Reviewed  POCT URINE DIPSTICK - Abnormal; Notable for the following components:      Result Value   Blood, UA small (*)    All other components within normal limits    EKG   Radiology No results found.  Procedures Procedures (including critical care time)  Medications Ordered in UC Medications - No data to display  Initial Impression / Assessment and Plan / UC Course  I have reviewed the triage vital signs and the nursing notes.  Pertinent labs & imaging results that were available during my care of the patient were reviewed by me and considered in my medical decision making (see chart for details).    Dysuria.  Patient has history of chronic cystitis.  Urine does not indicate infection at this time.  Discussed symptomatic treatment and education provided on dysuria.  Instructed her to follow-up with her PCP or urologist.  She agrees to plan of care.  Final Clinical  Impressions(s) / UC Diagnoses   Final diagnoses:  Dysuria      Discharge Instructions      Your urine does not indicate infection at this time.  Follow-up with your primary care provider or urologist if your symptoms are not improving.     ED Prescriptions   None    PDMP not reviewed this encounter.   Corlis Burnard DEL, NP 05/13/24 [phone]

## 2024-06-15 ENCOUNTER — Other Ambulatory Visit: Payer: Self-pay | Admitting: Nurse Practitioner

## 2024-06-15 DIAGNOSIS — N951 Menopausal and female climacteric states: Secondary | ICD-10-CM

## 2024-07-12 ENCOUNTER — Other Ambulatory Visit: Payer: Self-pay

## 2024-07-12 DIAGNOSIS — F419 Anxiety disorder, unspecified: Secondary | ICD-10-CM

## 2024-07-12 MED ORDER — ESCITALOPRAM OXALATE 5 MG PO TABS: 5.0000 mg | ORAL_TABLET | Freq: Every day | ORAL | 3 refills | Status: AC

## 2024-07-29 ENCOUNTER — Other Ambulatory Visit: Payer: Self-pay

## 2024-07-29 DIAGNOSIS — K219 Gastro-esophageal reflux disease without esophagitis: Secondary | ICD-10-CM

## 2024-07-29 MED ORDER — OMEPRAZOLE 40 MG PO CPDR
40.0000 mg | DELAYED_RELEASE_CAPSULE | Freq: Every day | ORAL | 3 refills | Status: AC
Start: 2024-07-29 — End: ?

## 2024-08-04 ENCOUNTER — Other Ambulatory Visit: Payer: Self-pay | Admitting: Nurse Practitioner

## 2024-08-04 DIAGNOSIS — N951 Menopausal and female climacteric states: Secondary | ICD-10-CM

## 2024-08-23 ENCOUNTER — Ambulatory Visit: Payer: BC Managed Care – PPO | Admitting: Dermatology

## 2024-08-23 ENCOUNTER — Encounter: Payer: Self-pay | Admitting: Dermatology

## 2024-08-23 DIAGNOSIS — Z1283 Encounter for screening for malignant neoplasm of skin: Secondary | ICD-10-CM | POA: Diagnosis not present

## 2024-08-23 DIAGNOSIS — L820 Inflamed seborrheic keratosis: Secondary | ICD-10-CM | POA: Diagnosis not present

## 2024-08-23 DIAGNOSIS — D225 Melanocytic nevi of trunk: Secondary | ICD-10-CM

## 2024-08-23 DIAGNOSIS — L814 Other melanin hyperpigmentation: Secondary | ICD-10-CM | POA: Diagnosis not present

## 2024-08-23 DIAGNOSIS — W908XXA Exposure to other nonionizing radiation, initial encounter: Secondary | ICD-10-CM

## 2024-08-23 DIAGNOSIS — L300 Nummular dermatitis: Secondary | ICD-10-CM

## 2024-08-23 DIAGNOSIS — Z86018 Personal history of other benign neoplasm: Secondary | ICD-10-CM

## 2024-08-23 DIAGNOSIS — D2261 Melanocytic nevi of right upper limb, including shoulder: Secondary | ICD-10-CM

## 2024-08-23 DIAGNOSIS — L578 Other skin changes due to chronic exposure to nonionizing radiation: Secondary | ICD-10-CM

## 2024-08-23 DIAGNOSIS — I8393 Asymptomatic varicose veins of bilateral lower extremities: Secondary | ICD-10-CM

## 2024-08-23 DIAGNOSIS — L719 Rosacea, unspecified: Secondary | ICD-10-CM

## 2024-08-23 DIAGNOSIS — L821 Other seborrheic keratosis: Secondary | ICD-10-CM

## 2024-08-23 DIAGNOSIS — D1801 Hemangioma of skin and subcutaneous tissue: Secondary | ICD-10-CM

## 2024-08-23 DIAGNOSIS — D229 Melanocytic nevi, unspecified: Secondary | ICD-10-CM

## 2024-08-23 DIAGNOSIS — L910 Hypertrophic scar: Secondary | ICD-10-CM

## 2024-08-23 MED ORDER — MOMETASONE FUROATE 0.1 % EX CREA: 1.0000 | TOPICAL_CREAM | Freq: Every day | CUTANEOUS | 2 refills | Status: AC | PRN

## 2024-08-23 NOTE — Progress Notes (Addendum)
 Follow-Up Visit   Subjective  Angela Park is a 63 y.o. female who presents for the following: Skin Cancer Screening and Full Body Skin Exam. Hx of dysplastic nevi. No personal Hx of skin cancer.   The patient presents for Total-Body Skin Exam (TBSE) for skin cancer screening and mole check. The patient has spots, moles and lesions to be evaluated, some may be new or changing and the patient may have concern these could be cancer.  Irritated spot on back.    The following portions of the chart were reviewed this encounter and updated as appropriate: medications, allergies, medical history  Review of Systems:  No other skin or systemic complaints except as noted in HPI or Assessment and Plan.  Objective  Well appearing patient in no apparent distress; mood and affect are within normal limits.  A full examination was performed including scalp, head, eyes, ears, nose, lips, neck, chest, axillae, abdomen, back, buttocks, bilateral upper extremities, bilateral lower extremities, hands, feet, fingers, toes, fingernails, and toenails. All findings within normal limits unless otherwise noted below.   Exam of nails limited by presence of nail polish.        Relevant physical exam findings are noted in the Assessment and Plan.  Right Lower Back x1 Erythematous keratotic or waxy stuck-on papule  Assessment & Plan   SKIN CANCER SCREENING PERFORMED TODAY.  HISTORY OF DYSPLASTIC NEVI No evidence of recurrence today Recommend regular full body skin exams Recommend daily broad spectrum sunscreen SPF 30+ to sun-exposed areas, reapply every 2 hours as needed.  Call if any new or changing lesions are noted between office visits   ACTINIC DAMAGE - Chronic condition, secondary to cumulative UV/sun exposure - diffuse scaly erythematous macules with underlying dyspigmentation - Recommend daily broad spectrum sunscreen SPF 30+ to sun-exposed areas, reapply every 2 hours as needed.  -  Staying in the shade or wearing long sleeves, sun glasses (UVA+UVB protection) and wide brim hats (4-inch brim around the entire circumference of the hat) are also recommended for sun protection.  - Call for new or changing lesions.  LENTIGINES, SEBORRHEIC KERATOSES, HEMANGIOMAS - Benign normal skin lesions - Benign-appearing - Call for any changes  MELANOCYTIC NEVI - Tan-brown and/or pink-flesh-colored symmetric macules and papules - R shoulder  5.0 mm speckled brown macule  (vs lentigo)  - R abdomen 4 x 3 mm brown papule two toned - Benign appearing on exam today - Observation Stable compared to previous visit. - Call clinic for new or changing moles - Recommend daily use of broad spectrum spf 30+ sunscreen to sun-exposed areas.     Nummular Dermatitis Exam: Pink scaly patches at posterior neck, back; pink papule at right clavicle- Benign features under dermoscopy, looks inflammatory. photo today  (recheck on follow-up)   Chronic and persistent condition with duration or expected duration over one year. Condition is bothersome/symptomatic for patient. Currently flared.   Nummular dermatitis (eczema) is a chronic, relapsing, itchy rash that can significantly affect quality of life. It is often associated with dry skin and flares in the wintertime, and may require treatment with prescription topical anti-inflammatory medications, in addition to gentle skin care.  If there is associated atopic dermatitis and topicals are not working, then biologic injections may be necessary to clear rash and control symptoms.  Treatment Plan: Start Mometasone cream once or twice daily to affected body areas as needed for itching. Avoid applying to face, groin, and axilla. Use as directed. Long-term use can cause thinning  of the skin.  RTC if spot on R chest doesn't resolve  Topical steroids (such as triamcinolone , fluocinolone, fluocinonide, mometasone, clobetasol, halobetasol, betamethasone,  hydrocortisone) can cause thinning and lightening of the skin if they are used for too long in the same area. Your physician has selected the right strength medicine for your problem and area affected on the body. Please use your medication only as directed by your physician to prevent side effects.    Recommend mild soap and moisturizing cream 1-2 times daily.  Gentle skin care handout provided.     Varicose Veins/Spider Veins - Dilated blue, purple or red veins at the lower extremities - Reassured - telangiectatic matting at previously treated area below left knee. - Smaller vessels can be treated by sclerotherapy (a procedure to inject a medicine into the veins to make them disappear) if desired, but the treatment is not covered by insurance. Larger vessels may be covered if symptomatic and we would refer to vascular surgeon if treatment desired.  ROSACEA Exam Mid face erythema with telangiectasias    Chronic and persistent condition with duration or expected duration over one year. Condition is improving with treatment but not currently at goal.    Rosacea is a chronic progressive skin condition usually affecting the face of adults, causing redness and/or acne bumps. It is treatable but not curable. It sometimes affects the eyes (ocular rosacea) as well. It may respond to topical and/or systemic medication and can flare with stress, sun exposure, alcohol, exercise, topical steroids (including hydrocortisone/cortisone 10) and some foods.  Daily application of broad spectrum spf 30+ sunscreen to face is recommended to reduce flares.   Patient denies grittiness of the eyes   Treatment Plan Use otc treatment  Recommend sunscreen and moisturizer   Counseling for BBL / IPL / Laser and Coordination of Care Discussed the treatment option of Broad Band Light (BBL) /Intense Pulsed Light (IPL)/ Laser for skin discoloration, including brown spots and redness.  Typically we recommend at least 1-3  treatment sessions about 5-8 weeks apart for best results.  Cannot have tanned skin when BBL performed, and regular use of sunscreen/photoprotection is advised after the procedure to help maintain results. The patient's condition may also require maintenance treatments in the future.  The fee for BBL / laser treatments is $350 per treatment session for the whole face.  A fee can be quoted for other parts of the body.  Insurance typically does not pay for BBL/laser treatments and therefore the fee is an out-of-pocket cost. Recommend prophylactic valtrex treatment. 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 Improved in the past with ILK injections. Discussed topical vs ILK injections vs both   Patient deferred ILK at this time. Continue Strataderm scar treatment QD/BID   INFLAMED SEBORRHEIC KERATOSIS Right Lower Back x1 Symptomatic, irritating, patient would like treated. Destruction of lesion - Right Lower Back x1  Destruction method: cryotherapy   Informed consent: discussed and consent obtained   Lesion destroyed using liquid nitrogen: Yes   Region frozen until ice ball extended beyond lesion: Yes   Outcome: patient tolerated procedure well with no complications   Post-procedure details: wound care instructions given   Additional details:  Prior to procedure, discussed risks of blister formation, small wound, skin dyspigmentation, or rare scar following cryotherapy. Recommend Vaseline ointment to treated areas  while healing.    Return in about 1 year (around 08/23/2025) for TBSE, HxDN.  I, Jill Parcell, CMA, am acting as scribe for Rexene Rattler, MD.   Documentation: I have reviewed the above documentation for accuracy and completeness, and I agree with the above.  Rexene Rattler, MD

## 2024-08-23 NOTE — Patient Instructions (Addendum)
 Start Mometasone cream once or twice daily to affected body areas as needed for itching. Avoid applying to face, groin, and axilla. Use as directed. Long-term use can cause thinning of the skin.  Topical steroids (such as triamcinolone , fluocinolone, fluocinonide, mometasone, clobetasol, halobetasol, betamethasone, hydrocortisone) can cause thinning and lightening of the skin if they are used for too long in the same area. Your physician has selected the right strength medicine for your problem and area affected on the body. Please use your medication only as directed by your physician to prevent side effects.   Cryotherapy Aftercare  Wash gently with soap and water everyday.   Apply Vaseline Jelly daily until healed.   Recommend mild soap and moisturizing cream 1-2 times daily.  Gentle skin care handout provided.    Gentle Skin Care Guide  1. Bathe no more than once a day.  2. Avoid bathing in hot water  3. Use a mild soap like Dove, Vanicream, Cetaphil, CeraVe. Can use Lever 2000 or Cetaphil antibacterial soap  4. Use soap only where you need it. On most days, use it under your arms, between your legs, and on your feet. Let the water rinse other areas unless visibly dirty.  5. When you get out of the bath/shower, use a towel to gently blot your skin dry, don't rub it.  6. While your skin is still a little damp, apply a moisturizing cream such as Vanicream, CeraVe, Cetaphil, Eucerin, Sarna lotion or plain Vaseline Jelly. For hands apply Neutrogena Norwegian Hand Cream or Excipial Hand Cream.  7. Reapply moisturizer any time you start to itch or feel dry.  8. Sometimes using free and clear laundry detergents can be helpful. Fabric softener sheets should be avoided. Downy Free & Gentle liquid, or any liquid fabric softener that is free of dyes and perfumes, it acceptable to use  9. If your doctor has given you prescription creams you may apply moisturizers over them      Recommend  daily broad spectrum sunscreen SPF 30+ to sun-exposed areas, reapply every 2 hours as needed. Call for new or changing lesions.  Staying in the shade or wearing long sleeves, sun glasses (UVA+UVB protection) and wide brim hats (4-inch brim around the entire circumference of the hat) are also recommended for sun protection.     Melanoma ABCDEs  Melanoma is the most dangerous type of skin cancer, and is the leading cause of death from skin disease.  You are more likely to develop melanoma if you: Have light-colored skin, light-colored eyes, or red or blond hair Spend a lot of time in the sun Tan regularly, either outdoors or in a tanning bed Have had blistering sunburns, especially during childhood Have a close family member who has had a melanoma Have atypical moles or large birthmarks  Early detection of melanoma is key since treatment is typically straightforward and cure rates are extremely high if we catch it early.   The first sign of melanoma is often a change in a mole or a new dark spot.  The ABCDE system is a way of remembering the signs of melanoma.  A for asymmetry:  The two halves do not match. B for border:  The edges of the growth are irregular. C for color:  A mixture of colors are present instead of an even brown color. D for diameter:  Melanomas are usually (but not always) greater than 6mm - the size of a pencil eraser. E for evolution:  The spot keeps  changing in size, shape, and color.  Please check your skin once per month between visits. You can use a small mirror in front and a large mirror behind you to keep an eye on the back side or your body.   If you see any new or changing lesions before your next follow-up, please call to schedule a visit.  Please continue daily skin protection including broad spectrum sunscreen SPF 30+ to sun-exposed areas, reapplying every 2 hours as needed when you're outdoors.   Staying in the shade or wearing long sleeves, sun glasses  (UVA+UVB protection) and wide brim hats (4-inch brim around the entire circumference of the hat) are also recommended for sun protection.       Due to recent changes in healthcare laws, you may see results of your pathology and/or laboratory studies on MyChart before the doctors have had a chance to review them. We understand that in some cases there may be results that are confusing or concerning to you. Please understand that not all results are received at the same time and often the doctors may need to interpret multiple results in order to provide you with the best plan of care or course of treatment. Therefore, we ask that you please give us  2 business days to thoroughly review all your results before contacting the office for clarification. Should we see a critical lab result, you will be contacted sooner.   If You Need Anything After Your Visit  If you have any questions or concerns for your doctor, please call our main line at [phone] and press option 4 to reach your doctor's medical assistant. If no one answers, please leave a voicemail as directed and we will return your call as soon as possible. Messages left after 4 pm will be answered the following business day.   You may also send us  a message via MyChart. We typically respond to MyChart messages within 1-2 business days.  For prescription refills, please ask your pharmacy to contact our office. Our fax number is [phone].  If you have an urgent issue when the clinic is closed that cannot wait until the next business day, you can page your doctor at the number below.    Please note that while we do our best to be available for urgent issues outside of office hours, we are not available 24/7.   If you have an urgent issue and are unable to reach us , you may choose to seek medical care at your doctor's office, retail clinic, urgent care center, or emergency room.  If you have a medical emergency, please immediately call  911 or go to the emergency department.  Pager Numbers  - Dr. Hester: [phone]  - Dr. Jackquline: [phone]  - Dr. Claudene: [phone]   - Dr. Raymund: [phone]  In the event of inclement weather, please call our main line at [phone] for an update on the status of any delays or closures.  Dermatology Medication Tips: Please keep the boxes that topical medications come in in order to help keep track of the instructions about where and how to use these. Pharmacies typically print the medication instructions only on the boxes and not directly on the medication tubes.   If your medication is too expensive, please contact our office at [phone] option 4 or send us  a message through MyChart.   We are unable to tell what your co-pay for medications will be in advance as this is different depending on your insurance coverage. However,  we may be able to find a substitute medication at lower cost or fill out paperwork to get insurance to cover a needed medication.   If a prior authorization is required to get your medication covered by your insurance company, please allow us  1-2 business days to complete this process.  Drug prices often vary depending on where the prescription is filled and some pharmacies may offer cheaper prices.  The website www.goodrx.com contains coupons for medications through different pharmacies. The prices here do not account for what the cost may be with help from insurance (it may be cheaper with your insurance), but the website can give you the price if you did not use any insurance.  - You can print the associated coupon and take it with your prescription to the pharmacy.  - You may also stop by our office during regular business hours and pick up a GoodRx coupon card.  - If you need your prescription sent electronically to a different pharmacy, notify our office through [facility] or by phone at [phone] option 4.     Si Usted  Necesita Algo Despus de Su Visita  Tambin puede enviarnos un mensaje a travs de Clinical Cytogeneticist. Por lo general respondemos a los mensajes de MyChart en el transcurso de 1 a 2 das hbiles.  Para renovar recetas, por favor pida a su farmacia que se ponga en contacto con nuestra oficina. Randi lakes de fax es [place] [phone].  Si tiene un asunto urgente cuando la clnica est cerrada y que no puede esperar hasta el siguiente da hbil, puede llamar/localizar a su doctor(a) al nmero que aparece a continuacin.   Por favor, tenga en cuenta que aunque hacemos todo lo posible para estar disponibles para asuntos urgentes fuera del horario de [place], no estamos disponibles las 24 horas del da, los 7 [address]  [address] de la [place].   Si tiene un problema urgente y no puede comunicarse con nosotros, puede optar por buscar atencin mdica  en el consultorio de su doctor(a), en una clnica privada, en un centro de atencin urgente o en una sala de emergencias.  Si tiene engineer, drilling, por favor llame inmediatamente al 911 o vaya a la sala de emergencias.  Nmeros de bper  - Dr. Hester: [phone]  - Dra. Jackquline: [phone]  - Dr. Claudene: [phone]  - Dra. Kitts: [phone]  En caso de inclemencias del [place], por favor llame a nuestra lnea principal al [phone] para una actualizacin sobre el estado de cualquier retraso o cierre.  Consejos para la medicacin en dermatologa: Por favor, guarde las cajas en las que vienen los medicamentos de uso tpico para ayudarle a seguir las instrucciones sobre dnde y cmo usarlos. Las farmacias generalmente imprimen las instrucciones del medicamento slo en las cajas y no directamente en los tubos del [place].   Si su medicamento es muy caro, por favor, pngase en contacto con landry rieger llamando al [phone] y presione la opcin 4 o envenos un mensaje a travs de Clinical Cytogeneticist.   No podemos decirle cul ser su copago por los medicamentos  por adelantado ya que esto es diferente dependiendo de la cobertura de su seguro. Sin embargo, es posible que podamos encontrar un medicamento sustituto a audiological scientist un formulario para que el seguro cubra el medicamento que se considera necesario.   Si se requiere una autorizacin previa para que su compaa de seguros cubra su medicamento, por favor permtanos de 1 a 2 das hbiles para completar este proceso.  Los precios de los medicamentos varan con frecuencia dependiendo del environmental consultant de dnde se surte la receta y alguna farmacias pueden ofrecer precios ms baratos.  El sitio web www.goodrx.com tiene cupones para medicamentos de health and safety inspector. Los precios aqu no tienen en cuenta lo que podra costar con la ayuda del seguro (puede ser ms barato con su seguro), pero el sitio web puede darle el precio si no utiliz tourist information centre manager.  - Puede imprimir el cupn correspondiente y llevarlo con su receta a la farmacia.  - Tambin puede pasar por nuestra oficina durante el horario de atencin regular y education officer, museum una tarjeta de cupones de GoodRx.  - Si necesita que su receta se enve electrnicamente a una farmacia diferente, informe a nuestra oficina a travs de MyChart de  o por telfono llamando al [phone] y presione la opcin 4.

## 2024-09-12 ENCOUNTER — Ambulatory Visit
Admission: EM | Admit: 2024-09-12 | Discharge: 2024-09-12 | Disposition: A | Discharge status: Home / Self Care | Attending: Emergency Medicine | Admitting: Emergency Medicine

## 2024-09-12 DIAGNOSIS — M545 Low back pain, unspecified: Secondary | ICD-10-CM | POA: Diagnosis not present

## 2024-09-12 LAB — POCT URINE DIPSTICK
Bilirubin, UA: NEGATIVE
Blood, UA: NEGATIVE
Glucose, UA: NEGATIVE mg/dL
Ketones, POC UA: NEGATIVE mg/dL
Leukocytes, UA: NEGATIVE
Nitrite, UA: NEGATIVE
POC PROTEIN,UA: NEGATIVE
Spec Grav, UA: 1.02
Urobilinogen, UA: 0.2 U/dL
pH, UA: 7

## 2024-09-12 NOTE — ED Triage Notes (Addendum)
 Patient to Urgent Care with complaints of lower back pain that radiates into her groin. Urinary frequency.  Symptoms started this afternoon.   Took 3 ibuprofen  PTA

## 2024-09-12 NOTE — Discharge Instructions (Addendum)
 Your urine does not show any abnormality today.  Take Tylenol or ibuprofen  as needed for discomfort.  Follow-up with your primary care provider.  Go to the emergency department if you have worsening symptoms.

## 2024-09-12 NOTE — ED Provider Notes (Signed)
 " CAY RALPH PELT    CSN: [id number] Arrival date & time: 09/12/24  1720      History   Chief Complaint Chief Complaint  Patient presents with   Urinary Frequency    HPI Angela Park is a 63 y.o. female.  Patient presents with an episode of sudden sharp pain in her left lower back that radiated around to her left groin at approximately 1400 this afternoon.  The episode was brief and sharp.  It has resolved and she is currently pain-free.  She reports some urinary frequency this afternoon.  She is concerned for possible UTI.  No fever, chills, dysuria, hematuria, flank pain.  She treated her symptoms with ibuprofen .  Her medical history includes chronic interstitial cystitis and kidney stones.  The history is provided by the patient and medical records.    Past Medical History:  Diagnosis Date   Atypical mole 11/22/2014   right mid back    Depression    Dysplastic nevus 2018   R ant hip, mod to severe   GERD (gastroesophageal reflux disease)    Interstitial cystitis     Patient Active Problem List   Diagnosis Date Noted   Renal calculi 11/12/2023   Pulmonary nodule, right 11/12/2023   Flank pain 11/02/2023   Prediabetes 04/22/2023   Acquired hypothyroidism 05/30/2016   Anxiety and depression 05/30/2016   Menopausal syndrome (hot flashes) 11/14/2014   GERD 12/18/2010   EUSTACHIAN TUBE DYSFUNCTION, RIGHT 12/25/2008   Otitis media 12/25/2008   HYPERCHOLESTEROLEMIA 10/10/2008   Chronic interstitial cystitis 09/12/2008   FLANK PAIN, LEFT 09/11/2008    Past Surgical History:  Procedure Laterality Date   PARTIAL HYSTERECTOMY  2005   TONSILLECTOMY  1967    OB History     Gravida  4   Para  3   Term  3   Preterm      AB  1   Living  3      SAB  1   IAB      Ectopic      Multiple      Live Births               Home Medications    Prior to Admission medications  Medication Sig Start Date End Date Taking? Authorizing Provider   ALPRAZolam  (XANAX ) 0.5 MG tablet Take 1 tablet (0.5 mg total) by mouth daily as needed for anxiety. 04/20/24   Lester, Kacy, NP  Cholecalciferol (VITAMIN D -1000 MAX ST) 25 MCG (1000 UT) tablet Take by mouth.    [provider]  escitalopram  (LEXAPRO ) 5 MG tablet Take 1 tablet (5 mg total) by mouth daily. 07/12/24   Gretel App, NP  estradiol  (ESTRACE ) 1 MG tablet TAKE 1 TABLET BY MOUTH EVERY DAY 08/04/24   Gretel App, NP  fexofenadine (ALLEGRA) 180 MG tablet Take by mouth.    [provider]  levothyroxine  (SYNTHROID ) 25 MCG tablet Take 1 tablet (25 mcg total) by mouth daily before breakfast. 04/22/23   Gretel App, NP  mometasone  (ELOCON ) 0.1 % cream Apply 1 Application topically daily as needed (Rash). Apply once or twice daily to affected body areas as needed for itching 08/23/24   Jackquline Sawyer, MD  Multiple Vitamin (MULTI-VITAMIN) tablet Take 1 tablet by mouth daily.    [provider]  mupirocin  ointment (BACTROBAN ) 2 % Apply 1 Application topically daily. Qd to to biopsy site on left 3rd finger 05/04/24   Jackquline Sawyer, MD  omeprazole  [facility]) 40  MG capsule Take 1 capsule (40 mg total) by mouth daily. 07/29/24   Gretel App, NP    Family History Family History  Problem Relation Age of Onset   Miscarriages / Stillbirths Mother    Hearing loss Mother    Diabetes Mother    Arthritis Mother    Depression Sister    Diabetes Sister    Depression Sister    Breast cancer Neg Hx     Social History Social History[1]   Allergies   Sulfonamide derivatives   Review of Systems Review of Systems  Constitutional:  Negative for chills and fever.  Gastrointestinal:  Negative for abdominal pain, blood in stool, constipation, diarrhea, nausea and vomiting.  Genitourinary:  Positive for frequency. Negative for dysuria, flank pain and hematuria.  Musculoskeletal:  Positive for back pain. Negative for gait problem.     Physical Exam Triage Vital Signs ED  Triage Vitals  Encounter Vitals Group     BP 09/12/24 1828 (!) 145/80     Girls Systolic BP Percentile --      Girls Diastolic BP Percentile --      Boys Systolic BP Percentile --      Boys Diastolic BP Percentile --      Pulse Rate 09/12/24 1828 71     Resp 09/12/24 1828 16     Temp 09/12/24 1828 98 F (36.7 C)     Temp src --      SpO2 09/12/24 1828 98 %     Weight --      Height --      Head Circumference --      Peak Flow --      Pain Score 09/12/24 1826 0     Pain Loc --      Pain Education --      Exclude from Growth Chart --    No data found.  Updated Vital Signs BP (!) 145/80   Pulse 71   Temp 98 F (36.7 C)   Resp 16   SpO2 98%   Visual Acuity Right Eye Distance:   Left Eye Distance:   Bilateral Distance:    Right Eye Near:   Left Eye Near:    Bilateral Near:     Physical Exam Constitutional:      General: She is not in acute distress. HENT:     Mouth/Throat:     Mouth: Mucous membranes are moist.  Cardiovascular:     Rate and Rhythm: Normal rate.  Pulmonary:     Effort: Pulmonary effort is normal. No respiratory distress.  Abdominal:     General: Bowel sounds are normal.     Palpations: Abdomen is soft.     Tenderness: There is no abdominal tenderness. There is no right CVA tenderness, left CVA tenderness, guarding or rebound.  Musculoskeletal:        General: No swelling, tenderness or deformity. Normal range of motion.  Skin:    General: Skin is warm and dry.     Findings: No bruising, erythema, lesion or rash.  Neurological:     General: No focal deficit present.     Mental Status: She is alert.     Sensory: No sensory deficit.     Motor: No weakness.     Gait: Gait normal.      UC Treatments / Results  Labs (all labs ordered are listed, but only abnormal results are displayed) Labs Reviewed  POCT URINE DIPSTICK    EKG  Radiology No results found.  Procedures Procedures (including critical care time)  Medications  Ordered in UC Medications - No data to display  Initial Impression / Assessment and Plan / UC Course  I have reviewed the triage vital signs and the nursing notes.  Pertinent labs & imaging results that were available during my care of the patient were reviewed by me and considered in my medical decision making (see chart for details).    Acute left lower back pain without sciatica.  Afebrile and vital signs are stable.  Patient had a brief episode of pain this afternoon which has resolved.  She has had some urinary frequency and is concerned for possible UTI.  Urine normal at this time.  Discussed symptomatic treatment including Tylenol or ibuprofen  as needed.  Instructed her to follow-up with her PCP.  ED precautions given.  She agrees to plan of care.  Final Clinical Impressions(s) / UC Diagnoses   Final diagnoses:  Acute left-sided low back pain without sciatica     Discharge Instructions      Your urine does not show any abnormality today.  Take Tylenol or ibuprofen  as needed for discomfort.  Follow-up with your primary care provider.  Go to the emergency department if you have worsening symptoms.     ED Prescriptions   None    PDMP not reviewed this encounter.    [1]  Social History Tobacco Use   Smoking status: Never   Smokeless tobacco: Never  Vaping Use   Vaping status: Never Used  Substance Use Topics   Alcohol use: Never   Drug use: Never     Corlis Burnard DEL, NP 09/12/24 1901  "

## 2024-10-25 ENCOUNTER — Ambulatory Visit: Admitting: Nurse Practitioner

## 2024-10-26 ENCOUNTER — Telehealth: Payer: Self-pay

## 2024-10-26 NOTE — Telephone Encounter (Signed)
 E fax refill request came for levothyroxine  last normal TSH lab was 04-20-24. Pts NOV: 12-09-24  Do you want pt to have a TSH lab done prior to refill ?    LOV: 04-20-24

## 2024-10-27 ENCOUNTER — Other Ambulatory Visit: Payer: Self-pay | Admitting: Nurse Practitioner

## 2024-10-27 DIAGNOSIS — E039 Hypothyroidism, unspecified: Secondary | ICD-10-CM

## 2024-10-27 MED ORDER — LEVOTHYROXINE SODIUM 25 MCG PO TABS: 25.0000 ug | ORAL_TABLET | Freq: Every day | ORAL | 3 refills | Status: AC

## 2024-12-09 ENCOUNTER — Ambulatory Visit: Admitting: Nurse Practitioner

## 2025-10-03 ENCOUNTER — Ambulatory Visit: Admitting: Dermatology
